# Patient Record
Sex: Female | Born: 1965 | Race: White | Hispanic: No | Marital: Married | Smoking: Never smoker
Health system: Southern US, Community
[De-identification: ages and names within clinical notes are randomized; demographics above are authoritative.]

## PROBLEM LIST (undated history)

## (undated) DIAGNOSIS — D509 Iron deficiency anemia, unspecified: Secondary | ICD-10-CM

## (undated) DIAGNOSIS — K635 Polyp of colon: Secondary | ICD-10-CM

## (undated) DIAGNOSIS — J309 Allergic rhinitis, unspecified: Secondary | ICD-10-CM

## (undated) DIAGNOSIS — K219 Gastro-esophageal reflux disease without esophagitis: Secondary | ICD-10-CM

## (undated) DIAGNOSIS — E669 Obesity, unspecified: Secondary | ICD-10-CM

## (undated) DIAGNOSIS — K649 Unspecified hemorrhoids: Secondary | ICD-10-CM

## (undated) DIAGNOSIS — E538 Deficiency of other specified B group vitamins: Secondary | ICD-10-CM

## (undated) DIAGNOSIS — E039 Hypothyroidism, unspecified: Secondary | ICD-10-CM

## (undated) DIAGNOSIS — E079 Disorder of thyroid, unspecified: Secondary | ICD-10-CM

## (undated) DIAGNOSIS — T7840XA Allergy, unspecified, initial encounter: Secondary | ICD-10-CM

## (undated) DIAGNOSIS — Z8632 Personal history of gestational diabetes: Secondary | ICD-10-CM

## (undated) DIAGNOSIS — K449 Diaphragmatic hernia without obstruction or gangrene: Secondary | ICD-10-CM

## (undated) HISTORY — DX: Gastro-esophageal reflux disease without esophagitis: K21.9

## (undated) HISTORY — DX: Unspecified hemorrhoids: K64.9

## (undated) HISTORY — DX: Deficiency of other specified B group vitamins: E53.8

## (undated) HISTORY — DX: Allergy, unspecified, initial encounter: T78.40XA

## (undated) HISTORY — DX: Polyp of colon: K63.5

## (undated) HISTORY — DX: Personal history of gestational diabetes: Z86.32

## (undated) HISTORY — DX: Hypothyroidism, unspecified: E03.9

## (undated) HISTORY — DX: Diaphragmatic hernia without obstruction or gangrene: K44.9

## (undated) HISTORY — DX: Iron deficiency anemia, unspecified: D50.9

## (undated) HISTORY — DX: Disorder of thyroid, unspecified: E07.9

## (undated) HISTORY — DX: Obesity, unspecified: E66.9

## (undated) HISTORY — DX: Allergic rhinitis, unspecified: J30.9

---

## 1998-02-23 ENCOUNTER — Emergency Department (HOSPITAL_COMMUNITY): Admission: EM | Admit: 1998-02-23 | Discharge: 1998-02-23 | Payer: Self-pay | Admitting: Emergency Medicine

## 1999-05-27 ENCOUNTER — Other Ambulatory Visit: Admission: RE | Admit: 1999-05-27 | Discharge: 1999-05-27 | Payer: Self-pay | Admitting: Obstetrics and Gynecology

## 1999-06-05 ENCOUNTER — Encounter: Admission: RE | Admit: 1999-06-05 | Discharge: 1999-09-03 | Payer: Self-pay | Admitting: Gynecology

## 1999-09-04 ENCOUNTER — Ambulatory Visit (HOSPITAL_COMMUNITY): Admission: RE | Admit: 1999-09-04 | Discharge: 1999-09-04 | Payer: Self-pay | Admitting: Obstetrics and Gynecology

## 1999-09-04 ENCOUNTER — Encounter: Payer: Self-pay | Admitting: Obstetrics and Gynecology

## 1999-10-10 ENCOUNTER — Ambulatory Visit (HOSPITAL_COMMUNITY): Admission: RE | Admit: 1999-10-10 | Discharge: 1999-10-10 | Payer: Self-pay | Admitting: Obstetrics and Gynecology

## 1999-10-10 ENCOUNTER — Encounter: Payer: Self-pay | Admitting: Obstetrics and Gynecology

## 1999-10-11 ENCOUNTER — Encounter: Admission: RE | Admit: 1999-10-11 | Discharge: 2000-01-09 | Payer: Self-pay | Admitting: Obstetrics and Gynecology

## 1999-11-07 ENCOUNTER — Ambulatory Visit (HOSPITAL_COMMUNITY): Admission: RE | Admit: 1999-11-07 | Discharge: 1999-11-07 | Payer: Self-pay | Admitting: Gynecology

## 1999-11-07 ENCOUNTER — Encounter: Payer: Self-pay | Admitting: Gynecology

## 1999-11-11 ENCOUNTER — Encounter (HOSPITAL_COMMUNITY): Admission: RE | Admit: 1999-11-11 | Discharge: 2000-01-03 | Payer: Self-pay | Admitting: Internal Medicine

## 1999-11-20 ENCOUNTER — Inpatient Hospital Stay (HOSPITAL_COMMUNITY): Admission: AD | Admit: 1999-11-20 | Discharge: 1999-11-20 | Payer: Self-pay | Admitting: Gynecology

## 1999-12-09 ENCOUNTER — Encounter: Payer: Self-pay | Admitting: Gynecology

## 2000-01-01 ENCOUNTER — Observation Stay (HOSPITAL_COMMUNITY): Admission: AD | Admit: 2000-01-01 | Discharge: 2000-01-02 | Payer: Self-pay | Admitting: Gynecology

## 2000-01-03 ENCOUNTER — Encounter (INDEPENDENT_AMBULATORY_CARE_PROVIDER_SITE_OTHER): Payer: Self-pay

## 2000-01-03 ENCOUNTER — Inpatient Hospital Stay (HOSPITAL_COMMUNITY): Admission: AD | Admit: 2000-01-03 | Discharge: 2000-01-07 | Payer: Self-pay | Admitting: Gynecology

## 2000-01-08 ENCOUNTER — Encounter: Admission: RE | Admit: 2000-01-08 | Discharge: 2000-02-11 | Payer: Self-pay | Admitting: Gynecology

## 2000-01-26 ENCOUNTER — Inpatient Hospital Stay (HOSPITAL_COMMUNITY): Admission: AD | Admit: 2000-01-26 | Discharge: 2000-01-27 | Payer: Self-pay | Admitting: Gynecology

## 2000-02-24 ENCOUNTER — Other Ambulatory Visit: Admission: RE | Admit: 2000-02-24 | Discharge: 2000-02-24 | Payer: Self-pay | Admitting: Gynecology

## 2002-07-27 ENCOUNTER — Encounter: Admission: RE | Admit: 2002-07-27 | Discharge: 2002-10-25 | Payer: Self-pay | Admitting: Internal Medicine

## 2002-11-09 ENCOUNTER — Encounter: Admission: RE | Admit: 2002-11-09 | Discharge: 2002-11-09 | Payer: Self-pay | Admitting: Internal Medicine

## 2002-11-09 ENCOUNTER — Encounter: Payer: Self-pay | Admitting: Internal Medicine

## 2002-11-14 ENCOUNTER — Encounter: Admission: RE | Admit: 2002-11-14 | Discharge: 2003-02-12 | Payer: Self-pay | Admitting: Internal Medicine

## 2003-02-15 ENCOUNTER — Encounter: Admission: RE | Admit: 2003-02-15 | Discharge: 2003-05-16 | Payer: Self-pay | Admitting: Internal Medicine

## 2003-04-14 ENCOUNTER — Other Ambulatory Visit: Admission: RE | Admit: 2003-04-14 | Discharge: 2003-04-14 | Payer: Self-pay | Admitting: Gynecology

## 2003-05-17 ENCOUNTER — Encounter: Admission: RE | Admit: 2003-05-17 | Discharge: 2003-08-15 | Payer: Self-pay | Admitting: Internal Medicine

## 2003-08-29 ENCOUNTER — Encounter: Admission: RE | Admit: 2003-08-29 | Discharge: 2003-11-27 | Payer: Self-pay | Admitting: Internal Medicine

## 2004-03-05 ENCOUNTER — Encounter: Admission: RE | Admit: 2004-03-05 | Discharge: 2004-03-05 | Payer: Self-pay | Admitting: Internal Medicine

## 2004-03-08 ENCOUNTER — Ambulatory Visit (HOSPITAL_COMMUNITY): Admission: RE | Admit: 2004-03-08 | Discharge: 2004-03-08 | Payer: Self-pay | Admitting: Internal Medicine

## 2004-09-09 ENCOUNTER — Other Ambulatory Visit: Admission: RE | Admit: 2004-09-09 | Discharge: 2004-09-09 | Payer: Self-pay | Admitting: Gynecology

## 2005-09-17 ENCOUNTER — Other Ambulatory Visit: Admission: RE | Admit: 2005-09-17 | Discharge: 2005-09-17 | Payer: Self-pay | Admitting: Gynecology

## 2005-10-06 HISTORY — PX: GASTRIC BYPASS: SHX52

## 2006-09-21 ENCOUNTER — Other Ambulatory Visit: Admission: RE | Admit: 2006-09-21 | Discharge: 2006-09-21 | Payer: Self-pay | Admitting: Gynecology

## 2007-06-29 ENCOUNTER — Encounter: Admission: RE | Admit: 2007-06-29 | Discharge: 2007-06-29 | Payer: Self-pay | Admitting: Internal Medicine

## 2007-10-13 ENCOUNTER — Other Ambulatory Visit: Admission: RE | Admit: 2007-10-13 | Discharge: 2007-10-13 | Payer: Self-pay | Admitting: Gynecology

## 2008-06-29 DIAGNOSIS — K449 Diaphragmatic hernia without obstruction or gangrene: Secondary | ICD-10-CM | POA: Insufficient documentation

## 2008-06-29 DIAGNOSIS — K224 Dyskinesia of esophagus: Secondary | ICD-10-CM

## 2008-07-05 ENCOUNTER — Ambulatory Visit: Payer: Self-pay | Admitting: Internal Medicine

## 2008-07-05 ENCOUNTER — Encounter: Payer: Self-pay | Admitting: Gastroenterology

## 2008-07-05 DIAGNOSIS — K219 Gastro-esophageal reflux disease without esophagitis: Secondary | ICD-10-CM | POA: Insufficient documentation

## 2008-07-05 DIAGNOSIS — E669 Obesity, unspecified: Secondary | ICD-10-CM

## 2008-07-05 DIAGNOSIS — K625 Hemorrhage of anus and rectum: Secondary | ICD-10-CM

## 2008-07-05 DIAGNOSIS — R159 Full incontinence of feces: Secondary | ICD-10-CM | POA: Insufficient documentation

## 2008-07-21 ENCOUNTER — Ambulatory Visit (HOSPITAL_COMMUNITY): Admission: RE | Admit: 2008-07-21 | Discharge: 2008-07-21 | Payer: Self-pay | Admitting: Gastroenterology

## 2008-07-21 ENCOUNTER — Ambulatory Visit: Payer: Self-pay | Admitting: Gastroenterology

## 2009-01-24 ENCOUNTER — Other Ambulatory Visit: Admission: RE | Admit: 2009-01-24 | Discharge: 2009-01-24 | Payer: Self-pay | Admitting: Gynecology

## 2009-01-24 ENCOUNTER — Encounter: Payer: Self-pay | Admitting: Gynecology

## 2009-01-24 ENCOUNTER — Ambulatory Visit: Payer: Self-pay | Admitting: Gynecology

## 2010-03-29 ENCOUNTER — Ambulatory Visit: Payer: Self-pay | Admitting: Gynecology

## 2010-04-09 ENCOUNTER — Other Ambulatory Visit: Admission: RE | Admit: 2010-04-09 | Discharge: 2010-04-09 | Payer: Self-pay | Admitting: Gynecology

## 2010-04-09 ENCOUNTER — Ambulatory Visit: Payer: Self-pay | Admitting: Gynecology

## 2010-04-22 ENCOUNTER — Ambulatory Visit: Payer: Self-pay | Admitting: Gynecology

## 2010-04-24 ENCOUNTER — Ambulatory Visit: Payer: Self-pay | Admitting: Gynecology

## 2010-07-04 ENCOUNTER — Ambulatory Visit: Payer: Self-pay | Admitting: Gynecology

## 2010-07-10 ENCOUNTER — Ambulatory Visit: Payer: Self-pay | Admitting: Gynecology

## 2010-08-06 HISTORY — PX: HYSTEROSCOPY: SHX211

## 2010-08-20 ENCOUNTER — Ambulatory Visit (HOSPITAL_COMMUNITY): Admission: RE | Admit: 2010-08-20 | Discharge: 2010-08-20 | Payer: Self-pay | Admitting: Gynecology

## 2010-08-20 ENCOUNTER — Ambulatory Visit: Payer: Self-pay | Admitting: Gynecology

## 2010-12-18 LAB — CBC
MCH: 30.9 pg (ref 26.0–34.0)
Platelets: 206 10*3/uL (ref 150–400)
RBC: 4.14 MIL/uL (ref 3.87–5.11)

## 2010-12-18 LAB — HCG, SERUM, QUALITATIVE: Preg, Serum: NEGATIVE

## 2011-02-21 NOTE — Op Note (Signed)
[facility]  Patient:    Crystal Wall, Crystal Wall                    MRN: [id number] Proc. Date: 01/25/00 Adm. Date:  [id number] Attending:  Merrily Pew                           Operative Report  PREOPERATIVE DIAGNOSIS:       Wound seroma, rule out abscess and dehiscence.  POSTOPERATIVE DIAGNOSIS:      Wound abscess.  PROCEDURE:                    Exploration and drainage, wound abscess.  SURGEON:                      Timothy P. Fontaine, M.D.  ANESTHESIA:                   IV sedation with local 1% lidocaine skin infiltration.  ESTIMATED BLOOD LOSS:         Minimal.  COMPLICATIONS:                None.  SPECIMENS:                    Aerobic and anaerobic culture.  FINDINGS:                     Deep superficial seroma with underlying abscess; fascial layer intact.  Incision packed with two six-inch Kerlix gauze.  DESCRIPTION OF PROCEDURE:     Patient was taken to the operating room and received IV sedation, received an abdominal preparation with Betadine solution and was draped in the usual fashion.  The skin surrounding the mid-incisional opening was infiltrated using 1% lidocaine and subsequently, the skin incision was separated. A large amount of serous fluid was found and subsequent probing of the subcutaneous tissues revealed frank pus and loculation consistent with abscess formation. Aerobic and anaerobic cultures were taken and subsequently, the subcutaneous tissues were probed to the extent of their dissection, with breaking up of all loculations.  The incision was copiously irrigated and rubbed with a sponge to remove all adherent material to the level of fresh subcutaneous tissue. Subsequently, a small bleeder was addressed with a cautery and the incision was  packed open using two six-inch Kerlix gauzes and a subsequent dressing applied.  The patient was taken to the recovery room in good condition, having  tolerated he procedure well.DD:  01/25/00 TD:  01/27/00 Job: [id number]

## 2011-02-21 NOTE — Discharge Summary (Signed)
[facility] of [facility]  Patient:    Crystal Wall                        MRN: [id number] Adm. Date:  01/03/00 Disc. Date: 01/07/00 Attending:  Marcial Pacas P. Fontaine, M.D. Dictator:   Antony Contras, [facility]                           Discharge Summary  DISCHARGE DIAGNOSES:          1. Intrauterine pregnancy at term.                               2. History of insulin-dependent gestational                                  diabetes.                               3. Morbid obesity.                               4. Hypothyroidism.                               5. Failed induction of labor.  PROCEDURES:                   Low cervical transverse cesarean section with                               delivery of viable infant.  HISTORY OF PRESENT ILLNESS:   The patient is a 45 year old primigravida with LMP of March 29, 1999, [facility] January 02, 2000.  Prenatal risk factors include insulin-dependent gestational diabetes, morbid obesity, hypothyroidism and the patient also has a negative Rubella titer.  PRENATAL LABS:                Blood type O positive. Antibody screen negative. RPR, HBSAG, HIV nonreactive.  GBS negative.  HOSPITAL COURSE/TREATMENT:    The patient was admitted on January 03, 2000, for serial induction.  Cervix was long, closed, high and unengaged.  She did understand that it was likely that the induction would probably not be successful due to her unfavorable cervix.  Due to the failure of this induction, a cesarean section was performed by Timothy P. Fontaine, M.D.  She was delivered of an Apgar 8 and 84 female infant weighing 6 pounds 12 ounces.  POSTPARTUM COURSE:            She remained afebrile.  She had no difficulty voiding.  She did have a JP drain for the incision and was placed on prophylactic antibiotics.  Her FBS ranged from 74 to 120.  She was discharged on January 07, 2000, in satisfactory condition.  She did receive rubella vaccine prior to  discharge.  The JP drain was out.  DISPOSITION:                  Continue prenatal vitamins and iron. Continue Motrin and Tylox for pain.  Follow-up in the office on Friday, January 10, 2000, and  Monday, January 13, 2000, to evaluate the status of the incision. DD:  07/06/00 TD:  07/06/00 Job: [id number]

## 2011-02-21 NOTE — Op Note (Signed)
[facility]  Patient:    Crystal Wall, Crystal Wall                         MRN: [id number] Proc. Date: 01/03/00 Adm. Date:  [id number] Disc. Date: [id number] Attending:  Douglass Rivers                           Operative Report  PREOPERATIVE DIAGNOSES:       1. Gestational diabetes, insulin-dependent.                               2. Term pregnancy.                               3. Failed induction.                               4. Morbidly obesity.                               5. Hypothyroidism.  POSTOPERATIVE DIAGNOSES:      1. Gestational diabetes, insulin-dependent.                               2. Term pregnancy.                               3. Failed induction.                               4. Morbid obesity.                               5. Hypothyroidism.  PROCEDURE:                    Primary classical cesarean section.  SURGEON:                      Timothy P. Fontaine, M.D.  ASSISTANT:                    Douglass Rivers, M.D.  ANESTHESIA:                   Epidural.  COMPLICATIONS:                None.  ESTIMATED BLOOD LOSS:         500 cc.  SPECIMEN:                     Samples of cord blood and placenta.  FINDINGS:                     At 10:28 normal female infant.  Apgars 8 and 9. Weighed 6 pounds 12 ounces.  Pelvic anatomy noted to be normal to limited inspection.  PROCEDURE:                    Patient underwent epidural anesthesia.  Was placed in the  supine position and had her panniculus taped to expose the lower abdomen. he abdomen was then prepared with Betadine scrub and Betadine solution.  A Foley catheter was placed in sterile technique and the patient was draped in the usual fashion.  The abdomen was then sharply entered at the level of the panniculus attempting to place the incision line out of the groin fold.  Adequate hemostasis was achieved along the way through electrocautery and the abdomen was sharply entered.  The  uterus was then exposed and due to the lack of development of a lower uterine segment, initially it was attempted to initiate a low midline incision ut it was found that an extension to the upper uterine segment was necessary and this was consistent with a classical cesarean section.  The membranes were identified, ruptured, and subsequently the infant was converted to a footling breech and underwent a breech extraction without difficulty.  The nares and mouth were suctioned, the cord doubly clamped and cut.  The infant was handed to pediatrics in attendance.  Samples of cord blood were obtained.  The placenta was then spontaneously extruded, noted to be intact.  The endometrial cavity explored with a sponge to remove all placental and membrane fragments.  The uterine incision was then closed in multiple layers initiating with a running interlocking intramuscular 0 Vicryl stitch followed by a running 0 Vicryl superficial muscular stitch. Several 2-0 Vicryl interrupted sutures were placed for hemostasis at the incision line and subsequently adequate hemostasis was achieved.  The uterus was returned to the abdomen which was copiously irrigated and subsequently the fascia was reapproximated using 0 PDS starting at the angle meeting in the middle.  Due to the large volume of subcutaneous tissue, it was felt most prudent to attempt to reapproximation loosely and interrupted 3-0 plain subcutaneous stitches were placed with two intervening Jackson-Pratt drains, one along the fascial layer and one n a more superficial cutaneous layer.  The skin was reapproximated with staples. The two Jackson-Pratt drains were secured to the skin using 3-0 silk suture. Sterile dressing applied.  The patient was taken to the recovery room in good condition  having tolerated procedure well. DD:  01/03/00 TD:  01/03/00 Job: [id number]

## 2011-02-21 NOTE — H&P (Signed)
[facility]  Patient:    MAJEL, GIEL                         MRN: [id number] Adm. Date:  [id number] Disc. Date: [id number] Attending:  Douglass Rivers                         History and Physical  CHIEF COMPLAINT:              Incisional complication.  HISTORY OF PRESENT ILLNESS:   A 45 year old female status post cesarean section  three weeks ago.  Notes over the last several days increasing temperature to 102 with mid incisional drainage.  Patient was evaluated in the office and started n oral Augmentin 875 b.i.d. yesterday and this evening noticed drainage of copious amounts of fluid from her incision.  Was instructed to report to the emergency room.  Evaluation in the emergency room revealed copious amounts of serous fluid from a mid incisional opening.  She is admitted at this time for exploration of her incision in the operating room.  PAST MEDICAL HISTORY:         Hypothyroidism.  PAST SURGICAL HISTORY:        Cesarean section and abdominal cyst excision.  ALLERGIES:                    None.  MEDICATIONS:                  Motrin, Augmentin, Synthroid 0.1 mg p.o. q.d.  REVIEW OF SYSTEMS:            Noncontributory.  FAMILY HISTORY:               Noncontributory.  SOCIAL HISTORY:               Noncontributory.  PHYSICAL EXAMINATION:  VITAL SIGNS:                  Temperature 100.8.  Vital signs are stable.  HEENT:                        Normal.  LUNGS:                        Clear.  CARDIAC:                      Regular rate.  No rubs, murmurs, or gallops.  ABDOMEN:                      Incision well healed except for small mid incisional area.  This area was probed initially and then subsequently injected with 1% lidocaine and opened approximately 1-2 cm allowing digital exploration and a copious amount of serous fluid extruded with palpation of a large cavity subincisional.  ASSESSMENT:                   A 45 year old  status post cesarean section x 3 weeks with large incisional seroma, possible abscess for surgical exploration.  I discussed with the patient and her husband what is involved and that my preference would be to accomplish this in the operating room and they agree to the plan with all their questions answered. DD:  01/24/00 TD:  01/25/00 Job: [id number]

## 2011-02-21 NOTE — Discharge Summary (Signed)
[facility]  Patient:    Crystal Wall, Crystal Wall                    MRN: [id number] Adm. Date:  [id number] Disc. Date: [id number] Attending:  Merrily Pew Dictator:   Antony Contras, RNC, [facility]                           Discharge Summary  DISCHARGE DIAGNOSES:          1. Postoperative cesarean section x 3 weeks.                               2. Wound abscess.  PROCEDURE:                    Exploration and drainage of the wound abscess.  HISTORY OF PRESENT ILLNESS:   The patient is a 45 year old female post cesarean  section x 3 weeks who noted over the last several days increasing temperature to 102 with some midincisional drainage.  She was initially evaluated in the office and started on Augmentin, but continued to notice copious amount of drainage from the incision.  She is admitted for exploration of the incision in the operating  room.  HOSPITAL COURSE:              The patient was admitted for operative exploration and drainage of the wound abscess which was performed by Timothy P. Fontaine, M.D. under IV sedation.  The findings showed a superficial seroma with underlying abscess.  The patient remained in the hospital for two days and the incision was packed with gauze, irrigated postoperatively.  She was continued on IV antibiotics. She responded well to this therapy and was able to be discharged on her second postoperative day to follow up in the office in a week and continue on her Tylox for pain. DD:  02/14/00 TD:  02/14/00 Job: [id number]

## 2011-04-15 ENCOUNTER — Other Ambulatory Visit (HOSPITAL_COMMUNITY)
Admission: RE | Admit: 2011-04-15 | Discharge: 2011-04-15 | Disposition: A | Discharge status: Home / Self Care | Payer: 59 | Source: Ambulatory Visit | Attending: Gynecology | Admitting: Gynecology

## 2011-04-15 ENCOUNTER — Encounter (INDEPENDENT_AMBULATORY_CARE_PROVIDER_SITE_OTHER): Payer: 59 | Admitting: Gynecology

## 2011-04-15 ENCOUNTER — Other Ambulatory Visit: Payer: Self-pay | Admitting: Gynecology

## 2011-04-15 DIAGNOSIS — R82998 Other abnormal findings in urine: Secondary | ICD-10-CM

## 2011-04-15 DIAGNOSIS — Z01419 Encounter for gynecological examination (general) (routine) without abnormal findings: Secondary | ICD-10-CM

## 2011-04-15 DIAGNOSIS — Z124 Encounter for screening for malignant neoplasm of cervix: Secondary | ICD-10-CM | POA: Insufficient documentation

## 2011-05-26 ENCOUNTER — Encounter: Payer: Self-pay | Admitting: Gynecology

## 2011-09-05 ENCOUNTER — Telehealth: Payer: Self-pay | Admitting: *Deleted

## 2011-09-05 NOTE — Telephone Encounter (Signed)
Pt called and left message c/o yeast infection and would like rx called in. Pt last office visit was in July, Lm on pt vm she would need to make OV.

## 2011-09-25 ENCOUNTER — Ambulatory Visit (INDEPENDENT_AMBULATORY_CARE_PROVIDER_SITE_OTHER): Payer: 59

## 2011-09-25 DIAGNOSIS — Z23 Encounter for immunization: Secondary | ICD-10-CM

## 2011-11-02 ENCOUNTER — Emergency Department (INDEPENDENT_AMBULATORY_CARE_PROVIDER_SITE_OTHER): Payer: No Typology Code available for payment source

## 2011-11-02 ENCOUNTER — Encounter (HOSPITAL_BASED_OUTPATIENT_CLINIC_OR_DEPARTMENT_OTHER): Payer: Self-pay | Admitting: *Deleted

## 2011-11-02 ENCOUNTER — Emergency Department (HOSPITAL_BASED_OUTPATIENT_CLINIC_OR_DEPARTMENT_OTHER)
Admission: EM | Admit: 2011-11-02 | Discharge: 2011-11-02 | Disposition: A | Discharge status: Home / Self Care | Payer: No Typology Code available for payment source | Attending: Emergency Medicine | Admitting: Emergency Medicine

## 2011-11-02 DIAGNOSIS — S8263XA Displaced fracture of lateral malleolus of unspecified fibula, initial encounter for closed fracture: Secondary | ICD-10-CM

## 2011-11-02 DIAGNOSIS — M7730 Calcaneal spur, unspecified foot: Secondary | ICD-10-CM

## 2011-11-02 DIAGNOSIS — Y92410 Unspecified street and highway as the place of occurrence of the external cause: Secondary | ICD-10-CM | POA: Insufficient documentation

## 2011-11-02 DIAGNOSIS — S93409A Sprain of unspecified ligament of unspecified ankle, initial encounter: Secondary | ICD-10-CM | POA: Insufficient documentation

## 2011-11-02 DIAGNOSIS — M7989 Other specified soft tissue disorders: Secondary | ICD-10-CM

## 2011-11-02 DIAGNOSIS — E039 Hypothyroidism, unspecified: Secondary | ICD-10-CM | POA: Insufficient documentation

## 2011-11-02 DIAGNOSIS — E669 Obesity, unspecified: Secondary | ICD-10-CM | POA: Insufficient documentation

## 2011-11-02 MED ORDER — IBUPROFEN 800 MG PO TABS
800.0000 mg | ORAL_TABLET | Freq: Once | ORAL | Status: AC
  Administered 2011-11-02: 800 mg via ORAL

## 2011-11-02 MED ORDER — IBUPROFEN 800 MG PO TABS: 800.0000 mg | ORAL_TABLET | Freq: Three times a day (TID) | ORAL | Status: AC

## 2011-11-02 MED ORDER — IBUPROFEN 800 MG PO TABS
ORAL_TABLET | ORAL | Status: AC
  Filled 2011-11-02: qty 1

## 2011-11-02 MED ORDER — IBUPROFEN 800 MG PO TABS: 800.0000 mg | ORAL_TABLET | Freq: Three times a day (TID) | ORAL | Status: DC

## 2011-11-02 NOTE — ED Notes (Signed)
Patient was not given crutches.

## 2011-11-02 NOTE — ED Notes (Signed)
Per EMT Lauren and PA Clydie Braun, the crutches we stock are not adequate for patient's weight bearing (bariatric) PA to order a walker for patient.
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
## 2011-11-02 NOTE — ED Provider Notes (Signed)
Medical screening examination/treatment/procedure(s) were performed by non-physician practitioner and as supervising physician I was immediately available for consultation/collaboration.   Forbes Cellar, MD 11/02/11 2333
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
## 2011-11-02 NOTE — ED Notes (Signed)
Patient fitted with large cam walker. Patient verbalizes comfort from fitting, and has been given instructions on care, etc. Onalee Hua, EMT in room for assistance in fitting.

## 2012-04-16 ENCOUNTER — Ambulatory Visit (INDEPENDENT_AMBULATORY_CARE_PROVIDER_SITE_OTHER): Payer: 59 | Admitting: Gynecology

## 2012-04-16 ENCOUNTER — Encounter: Payer: Self-pay | Admitting: Gynecology

## 2012-04-16 VITALS — BP 120/74 | Ht 62.0 in | Wt 367.0 lb

## 2012-04-16 DIAGNOSIS — Z01419 Encounter for gynecological examination (general) (routine) without abnormal findings: Secondary | ICD-10-CM

## 2012-04-16 DIAGNOSIS — N920 Excessive and frequent menstruation with regular cycle: Secondary | ICD-10-CM

## 2012-04-16 NOTE — Patient Instructions (Signed)
Follow up in one year for her annual gynecologic exam. 

## 2012-04-16 NOTE — Progress Notes (Signed)
Crystal Wall Feb 12, 1966 [id number]        46 y.o.  G1P1 for annual exam.  Several issues noted below.  Past medical history,surgical history, medications, allergies, family history and social history were all reviewed and documented in the EPIC chart. ROS:  Was performed and pertinent positives and negatives are included in the history.  Exam: [name] assistant Filed Vitals:   04/16/12 1616  BP: 120/74  Height: 5\' 2"  (1.575 m)  Weight: 367 lb (166.47 kg)   General appearance  Normal Skin grossly normal Head/Neck normal with no cervical or supraclavicular adenopathy thyroid normal Lungs  clear Cardiac RR, without RMG Abdominal  soft, nontender, without masses, organomegaly or hernia Breasts  examined lying and sitting without masses, retractions, discharge or axillary adenopathy. Pelvic  Ext/BUS/vagina  normal   Cervix  normal   Uterus difficult to palpate but grossly normal bimanual without masses or tenderness   Adnexa  Without masses or tenderness    Anus and perineum  normal   Rectovaginal  normal sphincter tone without palpated masses or tenderness.    Assessment/Plan:  46 y.o. G1P1 female for annual exam, vasectomy birth control.   1. Menorrhagia. Patient has a history of heavier menses. She is status post hysteroscopy D&C for benign endometrial polyp 08/2010. Menses are regular. Discussed options again with her to include hormonal ablation, Mirena IUD, endometrial ablation, hysterectomy. Patient does not want to do anything at this time. She'll follow up if she wants to pursue treatment. 2. Pap smear. Last Pap smear 2012. No Pap smear done today. Patient has no history of abnormal Pap smears with numerous normal reports in her chart. Discussed current screening guidelines we'll plan less frequent screening every 3-5 years. 3. Mammography. Patients do for her mammogram August 2013 in this to schedule this. SBE monthly reviewed. 4. Health maintenance. No blood work was done  today this is all done through her primary physician's office. Assuming she continues well she will see me in a year, sooner as needed.    [name], 4:49 PM 04/16/2012

## 2012-04-16 NOTE — Addendum Note (Signed)
Addended by: Dara Lords on: 04/16/2012 05:00 PM   Modules accepted: Orders

## 2012-05-31 ENCOUNTER — Encounter: Payer: Self-pay | Admitting: Gynecology

## 2013-06-07 ENCOUNTER — Ambulatory Visit (INDEPENDENT_AMBULATORY_CARE_PROVIDER_SITE_OTHER): Payer: 59 | Admitting: Gynecology

## 2013-06-07 ENCOUNTER — Telehealth: Payer: Self-pay | Admitting: Gastroenterology

## 2013-06-07 ENCOUNTER — Encounter: Payer: Self-pay | Admitting: Gynecology

## 2013-06-07 VITALS — BP 126/80 | Ht 62.0 in | Wt 367.0 lb

## 2013-06-07 DIAGNOSIS — Z01419 Encounter for gynecological examination (general) (routine) without abnormal findings: Secondary | ICD-10-CM

## 2013-06-07 NOTE — Progress Notes (Signed)
Crystal Wall [birth year] [id number]        47 y.o.  G1P1 for annual exam.  Doing well without complaints.  Past medical history,surgical history, medications, allergies, family history and social history were all reviewed and documented in the EPIC chart.  ROS:  Performed and pertinent positives and negatives are included in the history, assessment and plan .  Exam: Kim assistant Filed Vitals:   06/07/13 1359  BP: 126/80  Height: 5\' 2"  (1.575 m)  Weight: 367 lb (166.47 kg)   General appearance  Normal Skin grossly normal Head/Neck normal with no cervical or supraclavicular adenopathy thyroid normal Lungs  clear Cardiac RR, without RMG Abdominal  morbidly obese, soft, nontender, without gross masses, organomegaly or hernia Breasts  examined lying and sitting without masses, retractions, discharge or axillary adenopathy. Pelvic  Ext/BUS/vagina  normal  Cervix  normal  Uterus  difficult to palpate but grossly normal size, midline and mobile nontender   Adnexa  Without masses or tenderness    Anus and perineum  normal   Rectovaginal  normal sphincter tone without palpated masses or tenderness.    Assessment/Plan:  47 y.o. G1P1 female for annual exam, regular menses, vasectomy birth control.   1. Heavy menses x1 day. Regular monthly without intermenstrual bleeding. Have discussed the options with her and she is contemplating Mirena IUD. Literature was given and she'll followup if she chooses. 2. Mammography 05/2013. Continue with annual mammography. SBE monthly reviewed. 3. Pap smear 2012. No Pap smear done today. No history of abnormal Pap smears previously. Plan repeat Pap smear next year 3 year interval. 4. Health maintenance. Patient reports routine blood work done at other physician's offices. Followup in one year, sooner if she decides to pursue IUD.  Note: This document was prepared with digital dictation and possible smart phrase technology. Any transcriptional errors that  result from this process are unintentional.   [name], 2:28 PM 06/07/2013

## 2013-06-07 NOTE — Telephone Encounter (Signed)
Pt had colon in October of 2009, report in epic. Called office back but they are closed for lunch. Will try again after 2pm.

## 2013-06-07 NOTE — Telephone Encounter (Signed)
Pt last colon in October of 2009. Pts sister just recently passed away from colon cancer. PCP wants to know if it is ok for pt to wait until 2019 to repeat colon or should it be done sooner now with family history. Please advise.

## 2013-06-07 NOTE — Telephone Encounter (Signed)
She should have a colonoscopy this year

## 2013-06-07 NOTE — Patient Instructions (Signed)
Followup in one year for annual exam. Sooner if you decides to pursue Mirena IUD.

## 2013-06-08 ENCOUNTER — Encounter: Payer: Self-pay | Admitting: Gynecology

## 2013-06-09 NOTE — Telephone Encounter (Signed)
Spoke with [place] and she is aware and will let pt know to schedule colon some time this year.

## 2013-06-09 NOTE — Telephone Encounter (Signed)
Left message for [facility] to call back.

## 2013-07-12 ENCOUNTER — Encounter: Payer: Self-pay | Admitting: Gastroenterology

## 2013-07-14 ENCOUNTER — Encounter: Payer: 59 | Admitting: Gynecology

## 2013-08-11 ENCOUNTER — Other Ambulatory Visit: Payer: Self-pay

## 2013-08-16 ENCOUNTER — Telehealth: Payer: Self-pay | Admitting: Gastroenterology

## 2013-08-16 NOTE — Telephone Encounter (Signed)
Spoke with pt and discussed that if BMI is greater than or equal to 50 procedure must be done at the hospital. Pt states that nothing has changed in 5 years so pt should be done at [facility]. Pt requests an appt on a Wed if possible. If cannot be done on a Wed. Pt stated it would need to be done after the 1st of the year.

## 2013-08-16 NOTE — Telephone Encounter (Signed)
Left message for pt to call back  °

## 2013-08-26 ENCOUNTER — Other Ambulatory Visit: Payer: Self-pay

## 2013-08-26 ENCOUNTER — Telehealth: Payer: Self-pay | Admitting: Gastroenterology

## 2013-08-26 DIAGNOSIS — Z1211 Encounter for screening for malignant neoplasm of colon: Secondary | ICD-10-CM

## 2013-08-26 NOTE — Telephone Encounter (Signed)
Discussed with pt that Dr. Arlyce Dice does not have a spot on a Wednesday before the end of the year. Pt is fine waiting to be scheduled in Jan.

## 2013-08-26 NOTE — Telephone Encounter (Signed)
Pt scheduled for Colon at [facility] 10/24/12@12 :30pm. Pt scheduled for previsit 10/11/13@8am . Pt aware of appt dates and times.

## 2013-08-26 NOTE — Telephone Encounter (Signed)
Pts colon needs to be done at [facility] in Jan. Cannot do 1/15 or 1/16. Will call pt back when scheduled.

## 2013-10-11 ENCOUNTER — Ambulatory Visit (AMBULATORY_SURGERY_CENTER): Payer: Self-pay

## 2013-10-11 VITALS — Ht 62.0 in | Wt 381.8 lb

## 2013-10-11 DIAGNOSIS — Z800 Family history of malignant neoplasm of digestive organs: Secondary | ICD-10-CM

## 2013-10-11 MED ORDER — SUPREP BOWEL PREP KIT 17.5-3.13-1.6 GM/177ML PO SOLN: 1.0000 | Freq: Once | ORAL | Status: DC

## 2013-10-12 ENCOUNTER — Encounter: Payer: Self-pay | Admitting: Gastroenterology

## 2013-10-17 ENCOUNTER — Other Ambulatory Visit: Payer: Self-pay | Admitting: *Deleted

## 2013-10-17 DIAGNOSIS — Z800 Family history of malignant neoplasm of digestive organs: Secondary | ICD-10-CM

## 2013-10-24 ENCOUNTER — Encounter (HOSPITAL_COMMUNITY)
Admission: RE | Disposition: A | Discharge status: Home / Self Care | Payer: Self-pay | Source: Ambulatory Visit | Attending: Gastroenterology

## 2013-10-24 ENCOUNTER — Ambulatory Visit (HOSPITAL_COMMUNITY)
Admission: RE | Admit: 2013-10-24 | Discharge: 2013-10-24 | Disposition: A | Discharge status: Home / Self Care | Payer: 59 | Source: Ambulatory Visit | Attending: Gastroenterology | Admitting: Gastroenterology

## 2013-10-24 ENCOUNTER — Encounter (HOSPITAL_COMMUNITY): Payer: Self-pay

## 2013-10-24 DIAGNOSIS — D126 Benign neoplasm of colon, unspecified: Secondary | ICD-10-CM

## 2013-10-24 DIAGNOSIS — Z9884 Bariatric surgery status: Secondary | ICD-10-CM | POA: Insufficient documentation

## 2013-10-24 DIAGNOSIS — E039 Hypothyroidism, unspecified: Secondary | ICD-10-CM | POA: Insufficient documentation

## 2013-10-24 DIAGNOSIS — Z1211 Encounter for screening for malignant neoplasm of colon: Secondary | ICD-10-CM | POA: Insufficient documentation

## 2013-10-24 DIAGNOSIS — E669 Obesity, unspecified: Secondary | ICD-10-CM | POA: Insufficient documentation

## 2013-10-24 DIAGNOSIS — Z800 Family history of malignant neoplasm of digestive organs: Secondary | ICD-10-CM

## 2013-10-24 HISTORY — PX: COLONOSCOPY: SHX5424

## 2013-10-24 SURGERY — COLONOSCOPY
Anesthesia: Moderate Sedation

## 2013-10-24 MED ORDER — FENTANYL CITRATE 0.05 MG/ML IJ SOLN
INTRAMUSCULAR | Status: DC | PRN
  Administered 2013-10-24 (×4): 25 ug via INTRAVENOUS

## 2013-10-24 MED ORDER — SODIUM CHLORIDE 0.9 % IV SOLN
INTRAVENOUS | Status: DC
  Administered 2013-10-24: 500 mL via INTRAVENOUS

## 2013-10-24 MED ORDER — MIDAZOLAM HCL 5 MG/5ML IJ SOLN
INTRAMUSCULAR | Status: DC | PRN
  Administered 2013-10-24: 1 mg via INTRAVENOUS
  Administered 2013-10-24: 2 mg via INTRAVENOUS
  Administered 2013-10-24: 1 mg via INTRAVENOUS
  Administered 2013-10-24: 2 mg via INTRAVENOUS
  Administered 2013-10-24: 1 mg via INTRAVENOUS

## 2013-10-24 MED ORDER — MIDAZOLAM HCL 10 MG/2ML IJ SOLN
INTRAMUSCULAR | Status: AC
  Filled 2013-10-24: qty 2

## 2013-10-24 MED ORDER — FENTANYL CITRATE 0.05 MG/ML IJ SOLN
INTRAMUSCULAR | Status: AC
  Filled 2013-10-24: qty 4

## 2013-10-24 NOTE — Op Note (Signed)
[facility] [place], [address]   COLONOSCOPY PROCEDURE REPORT  PATIENT: Crystal Wall, Crystal Wall  MR#: [id number] BIRTHDATE: [birth year] , 40  yrs. old GENDER: Female ENDOSCOPIST: Inda Castle, MD REFERRED Mariel Craft, M.D. PROCEDURE DATE:  10/24/2013 PROCEDURE:   Colonoscopy with snare polypectomy First Screening Colonoscopy - Avg.  risk and is 50 yrs.  old or older - No.  Prior Negative Screening - Now for repeat screening. Above average risk  History of Adenoma - Now for follow-up colonoscopy & has been > or = to 3 yrs.  N/A  Polyps Removed Today? Yes. ASA CLASS:   Class III INDICATIONS:Patient's immediate family history of colon cancer. MEDICATIONS: These medications were titrated to patient response per physician's verbal order, Versed 7 mg IV, and Fentanyl 100 mcg IV  DESCRIPTION OF PROCEDURE:   After the risks benefits and alternatives of the procedure were thoroughly explained, informed consent was obtained.  A digital rectal exam revealed no abnormalities of the rectum.   The Pentax Adult Colonscope [id number] endoscope was introduced through the anus and advanced to the cecum, which was identified by both the appendix and ileocecal valve. No adverse events experienced.   The quality of the prep was excellent using Suprep  The instrument was then slowly withdrawn as the colon was fully examined.      COLON FINDINGS: A sessile polyp measuring 4 mm in size was found in the descending colon.  A polypectomy was performed with a cold snare.  The resection was complete and the polyp tissue was completely retrieved.   The colon was otherwise normal.  There was no diverticulosis, inflammation, polyps or cancers unless previously stated.  Retroflexed views revealed no abnormalities. The time to cecum=  .  Withdrawal time=11 minutes 0 seconds.  The scope was withdrawn and the procedure completed. COMPLICATIONS: There were no  complications.  ENDOSCOPIC IMPRESSION: 1.   Sessile polyp measuring 4 mm in size was found in the descending colon; polypectomy was performed with a cold snare 2.   The colon was otherwise normal  RECOMMENDATIONS: Given your significant family history of colon cancer, you should have a repeat colonoscopy in 5 years   eSigned:  Inda Castle, MD 10/24/2013 1:09 PM   cc:   PATIENT NAME:  Crystal Wall, Crystal Wall MR#: [id number]

## 2013-10-24 NOTE — H&P (Signed)
                _                                                                                                                History of Present Illness:  48 year old white female here for colonoscopy.  Family history is pertinent for sister who developed colon cancer in her 1s.  Patient has no GI complaints.  Last colonoscopy in 2009 demonstrated internal hemorrhoids.    Past Medical History  Diagnosis Date  . Obesity   . Thyroid disease     hypothyroid  . Hx gestational diabetes    Past Surgical History  Procedure Laterality Date  . Cesarean section  2001  . Gastric bypass  2007  . Hysteroscopy  08/2010   family history includes Cancer in her father and sister; Colon cancer in her sister; Congestive Heart Failure in her father; Diabetes in her maternal grandmother and sister; Heart disease in her father; Hypertension in her father, mother, and sister. Current Facility-Administered Medications  Medication Dose Route Frequency Provider Last Rate Last Dose  . 0.9 %  sodium chloride infusion   Intravenous Continuous Inda Castle, MD 20 mL/hr at 10/24/13 1142 500 mL at 10/24/13 1142   Allergies as of 08/26/2013  . (No Known Allergies)    reports that she has never smoked. She has never used smokeless tobacco. She reports that she drinks alcohol. She reports that she does not use illicit drugs.     Review of Systems: Pertinent positive and negative review of systems were noted in the above HPI section. All other review of systems were otherwise negative.  Vital signs were reviewed in today's medical record Physical Exam: General: Well developed , well nourished, no acute distress Skin: anicteric Head: Normocephalic and atraumatic Eyes:  sclerae anicteric, EOMI Ears: Normal auditory acuity Mouth: No deformity or lesions Neck: Supple, no masses or thyromegaly Lungs: Clear throughout to auscultation Heart: Regular rate and rhythm; no murmurs, rubs or bruits Abdomen:  Soft, non tender and non distended. No masses, hepatosplenomegaly or hernias noted. Normal Bowel sounds Rectal:deferred Musculoskeletal: Symmetrical with no gross deformities  Skin: No lesions on visible extremities Pulses:  Normal pulses noted Extremities: No clubbing, cyanosis, edema or deformities noted Neurological: Alert oriented x 4, grossly nonfocal Cervical Nodes:  No significant cervical adenopathy Inguinal Nodes: No significant inguinal adenopathy Psychological:  Alert and cooperative. Normal mood and affect  Impression-family history of colon cancer  Plan colonoscopy

## 2013-10-24 NOTE — Discharge Instructions (Signed)
Colonoscopy, Care After °Refer to this sheet in the next few weeks. These instructions provide you with information on caring for yourself after your procedure. Your health care provider may also give you more specific instructions. Your treatment has been planned according to current medical practices, but problems sometimes occur. Call your health care provider if you have any problems or questions after your procedure. °WHAT TO EXPECT AFTER THE PROCEDURE  °After your procedure, it is typical to have the following: °· A small amount of blood in your stool. °· Moderate amounts of gas and mild abdominal cramping or bloating. °HOME CARE INSTRUCTIONS °· Do not drive, operate machinery, or sign important documents for 24 hours. °· You may shower and resume your regular physical activities, but move at a slower pace for the first 24 hours. °· Take frequent rest periods for the first 24 hours. °· Walk around or put a warm pack on your abdomen to help reduce abdominal cramping and bloating. °· Drink enough fluids to keep your urine clear or pale yellow. °· You may resume your normal diet as instructed by your health care provider. Avoid heavy or fried foods that are hard to digest. °· Avoid drinking alcohol for 24 hours or as instructed by your health care provider. °· Only take over-the-counter or prescription medicines as directed by your health care provider. °· If a tissue sample (biopsy) was taken during your procedure: °· Do not take aspirin or blood thinners for 7 days, or as instructed by your health care provider. °· Do not drink alcohol for 7 days, or as instructed by your health care provider. °· Eat soft foods for the first 24 hours. °SEEK MEDICAL CARE IF: °You have persistent spotting of blood in your stool 2 3 days after the procedure. °SEEK IMMEDIATE MEDICAL CARE IF: °· You have more than a small spotting of blood in your stool. °· You pass large blood clots in your stool. °· Your abdomen is swollen  (distended). °· You have nausea or vomiting. °· You have a fever. °· You have increasing abdominal pain that is not relieved with medicine. °Document Released: 05/06/2004 Document Revised: 07/13/2013 Document Reviewed: 05/30/2013 °ExitCare® Patient Information ©2014 [employer]. ° °

## 2013-10-25 ENCOUNTER — Encounter (HOSPITAL_COMMUNITY): Payer: Self-pay | Admitting: Gastroenterology

## 2013-10-28 ENCOUNTER — Encounter: Payer: Self-pay | Admitting: Gastroenterology

## 2013-11-03 ENCOUNTER — Telehealth: Payer: Self-pay | Admitting: *Deleted

## 2013-11-03 NOTE — Telephone Encounter (Signed)
Pt called c/o no cycle since Nov 2014, ? If in menopause. I left message on pt voicemail to make OV with TF.

## 2014-01-03 ENCOUNTER — Ambulatory Visit (INDEPENDENT_AMBULATORY_CARE_PROVIDER_SITE_OTHER): Payer: 59 | Admitting: Gynecology

## 2014-01-03 ENCOUNTER — Encounter: Payer: Self-pay | Admitting: Gynecology

## 2014-01-03 DIAGNOSIS — N926 Irregular menstruation, unspecified: Secondary | ICD-10-CM

## 2014-01-03 DIAGNOSIS — D649 Anemia, unspecified: Secondary | ICD-10-CM

## 2014-01-03 DIAGNOSIS — N920 Excessive and frequent menstruation with regular cycle: Secondary | ICD-10-CM

## 2014-01-03 LAB — CBC WITH DIFFERENTIAL/PLATELET
BASOS ABS: 0.1 10*3/uL (ref 0.0–0.1)
BASOS PCT: 1 % (ref 0–1)
EOS PCT: 3 % (ref 0–5)
Eosinophils Absolute: 0.2 10*3/uL (ref 0.0–0.7)
HEMATOCRIT: 31.7 % — AB (ref 36.0–46.0)
HEMOGLOBIN: 9.7 g/dL — AB (ref 12.0–15.0)
Lymphocytes Relative: 23 % (ref 12–46)
Lymphs Abs: 1.4 10*3/uL (ref 0.7–4.0)
MCH: 22.7 pg — ABNORMAL LOW (ref 26.0–34.0)
MCHC: 30.6 g/dL (ref 30.0–36.0)
MCV: 74.2 fL — AB (ref 78.0–100.0)
MONOS PCT: 5 % (ref 3–12)
Monocytes Absolute: 0.3 10*3/uL (ref 0.1–1.0)
Neutro Abs: 4.3 10*3/uL (ref 1.7–7.7)
Neutrophils Relative %: 68 % (ref 43–77)
Platelets: 290 10*3/uL (ref 150–400)
RBC: 4.27 MIL/uL (ref 3.87–5.11)
RDW: 17.3 % — AB (ref 11.5–15.5)
WBC: 6.3 10*3/uL (ref 4.0–10.5)

## 2014-01-03 LAB — FOLLICLE STIMULATING HORMONE: FSH: 5.9 m[IU]/mL

## 2014-01-03 LAB — PROLACTIN: Prolactin: 9.9 ng/mL

## 2014-01-03 NOTE — Progress Notes (Signed)
Crystal Wall [birth year] [id number]        48 y.o.  G1P1 presents to discuss options as far as heavier periods.  Patient is having menses approximately every 6 weeks heavy for one day. No prolonged or intermenstrual bleeding. Was found to be anemic by her primary and was told that they thought was due to her periods and to talk to me about possible endometrial ablation. Had normal exam 06/2013. Declines exam today he just wants to talk.  Past medical history,surgical history, problem list, medications, allergies, family history and social history were all reviewed and documented in the EPIC chart.   Assessment/Plan:  48 y.o. G1P1 with one to 2 days of heavy menses every 6 weeks. I reviewed options for menstrual suppression to include hormonal/IUD/endometrial ablation. Using vasectomy for contraception. The pros/cons, risks/benefits of each choice reviewed. Do not feel estrogen containing products-wise given the risks of thrombosis. Progesterone only with the side effect profile possibilities. I think her best choice would be to consider Mirena IUD initially with followup possible endometrial ablation if fails the IUD. The progesterone effect of the IUD will also protect the endometrium given her less frequent menses. Does have her thyroid checked every 6 months and reports being euthyroid. Will check FSH prolactin and baseline CBC. Do not feel sonohysterogram necessary at this point but would if she wants to proceed with ablation as a preoperative evaluation. Patient is leaning towards the IUD and will schedule. She'll followup if she wants to rediscuss options.   Note: This document was prepared with digital dictation and possible smart phrase technology. Any transcriptional errors that result from this process are unintentional.   [name] MD, 9:44 AM 01/03/2014

## 2014-01-03 NOTE — Patient Instructions (Signed)
Followup with your decision as far as IUD.  Intrauterine Device Insertion Most often, an intrauterine device (IUD) is inserted into the uterus to prevent pregnancy. There are 2 types of IUDs available:  Copper IUD This type of IUD creates an environment that is not favorable to sperm survival. The mechanism of action of the copper IUD is not known for certain. It can stay in place for 10 years.  Hormone IUD This type of IUD contains the hormone progestin (synthetic progesterone). The progestin thickens the cervical mucus and prevents sperm from entering the uterus, and it also thins the uterine lining. There is no evidence that the hormone IUD prevents implantation. One hormone IUD can stay in place for up to 5 years, and a different hormone IUD can stay in place for up to 3 years. An IUD is the most cost-effective birth control if left in place for the full duration. It may be removed at any time. LET [facility] CARE PROVIDER KNOW ABOUT:  Any allergies you have.  All medicines you are taking, including vitamins, herbs, eye drops, creams, and over-the-counter medicines.  Previous problems you or members of your family have had with the use of anesthetics.  Any blood disorders you have.  Previous surgeries you have had.  Possibility of pregnancy.  Medical conditions you have. RISKS AND COMPLICATIONS  Generally, intrauterine device insertion is a safe procedure. However, as with any procedure, complications can occur. Possible complications include:  Accidental puncture (perforation) of the uterus.  Accidental placement of the IUD either in the muscle layer of the uterus (myometrium) or outside the uterus. If this happens, the IUD can be found essentially floating around the bowels and must be taken out surgically.  The IUD may fall out of the uterus (expulsion). This is more common in women who have recently had a child.   Pregnancy in the fallopian tube (ectopic).  Pelvic  inflammatory disease (PID), which is infection of the uterus and fallopian tubes. The risk of PID is slightly increased in the first 20 days after the IUD is placed, but the overall risk is still very low. BEFORE THE PROCEDURE  Schedule the IUD insertion for when you will have your menstrual period or right after, to make sure you are not pregnant. Placement of the IUD is better tolerated shortly after a menstrual cycle.  You may need to take tests or be examined to make sure you are not pregnant.  You may be required to take a pregnancy test.  You may be required to get checked for sexually transmitted infections (STIs) prior to placement. Placing an IUD in someone who has an infection can make the infection worse.  You may be given a pain reliever to take 1 or 2 hours before the procedure.  An exam will be performed to determine the size and position of your uterus.  Ask your health care provider about changing or stopping your regular medicines. PROCEDURE   A tool (speculum) is placed in the vagina. This allows your health care provider to see the lower part of the uterus (cervix).  The cervix is prepped with a medicine that lowers the risk of infection.  You may be given a medicine to numb each side of the cervix (intracervical or paracervical block). This is used to block and control any discomfort with insertion.  A tool (uterine sound) is inserted into the uterus to determine the length of the uterine cavity and the direction the uterus may be tilted.  A slim instrument (IUD inserter) is inserted through the cervical canal and into your uterus.  The IUD is placed in the uterine cavity and the insertion device is removed.  The nylon string that is attached to the IUD and used for eventual IUD removal is trimmed. It is trimmed so that it lays high in the vagina, just outside the cervix. AFTER THE PROCEDURE  You may have bleeding after the procedure. This is normal. It varies  from light spotting for a few days to menstrual-like bleeding.  You may have mild cramping. Document Released: 05/21/2011 Document Revised: 07/13/2013 Document Reviewed: 03/13/2013 [facility] Patient Information 2014 [address].

## 2014-01-04 ENCOUNTER — Encounter: Payer: Self-pay | Admitting: Gynecology

## 2014-01-04 ENCOUNTER — Telehealth: Payer: Self-pay | Admitting: Gynecology

## 2014-01-04 ENCOUNTER — Other Ambulatory Visit: Payer: Self-pay | Admitting: Gynecology

## 2014-01-04 DIAGNOSIS — Z3049 Encounter for surveillance of other contraceptives: Secondary | ICD-10-CM

## 2014-01-04 MED ORDER — LEVONORGESTREL 20 MCG/24HR IU IUD: INTRAUTERINE_SYSTEM | Freq: Once | INTRAUTERINE | Status: DC

## 2014-01-04 NOTE — Telephone Encounter (Signed)
01/04/14-Spoke with pt today to let her know her insurance will cover the Mirena and insertion with a $35.00 copay. She will call to schedule this in a couple of months.WL

## 2014-02-16 ENCOUNTER — Telehealth: Payer: Self-pay | Admitting: *Deleted

## 2014-02-16 NOTE — Telephone Encounter (Signed)
Pt informed with recent [place] level result.

## 2014-06-09 ENCOUNTER — Encounter: Payer: Self-pay | Admitting: Gynecology

## 2014-06-29 ENCOUNTER — Encounter: Payer: Self-pay | Admitting: Gynecology

## 2014-06-29 ENCOUNTER — Ambulatory Visit (INDEPENDENT_AMBULATORY_CARE_PROVIDER_SITE_OTHER): Payer: 59 | Admitting: Gynecology

## 2014-06-29 ENCOUNTER — Other Ambulatory Visit (HOSPITAL_COMMUNITY): Admission: RE | Admit: 2014-06-29 | Payer: 59 | Source: Ambulatory Visit | Admitting: Gynecology

## 2014-06-29 ENCOUNTER — Other Ambulatory Visit (HOSPITAL_COMMUNITY)
Admission: RE | Admit: 2014-06-29 | Discharge: 2014-06-29 | Disposition: A | Discharge status: Home / Self Care | Payer: 59 | Source: Ambulatory Visit | Attending: Gynecology | Admitting: Gynecology

## 2014-06-29 VITALS — BP 130/82 | Ht 61.5 in | Wt 374.0 lb

## 2014-06-29 DIAGNOSIS — Z01419 Encounter for gynecological examination (general) (routine) without abnormal findings: Secondary | ICD-10-CM | POA: Diagnosis present

## 2014-06-29 NOTE — Patient Instructions (Signed)
Follow up for the ultrasound as scheduled. Start on a daily iron supplement.

## 2014-06-29 NOTE — Progress Notes (Signed)
Crystal Wall 1966/09/19 [id number]        48 y.o.  G1P1 for annual exam.  Several issues noted below.  Past medical history,surgical history, problem list, medications, allergies, family history and social history were all reviewed and documented as reviewed in the EPIC chart.  ROS:  12 system ROS performed with pertinent positives and negatives included in the history, assessment and plan.   Additional significant findings :  none   Exam: Kim Counsellor Vitals:   06/29/14 1430  BP: 130/82  Height: 5' 1.5" (1.562 m)  Weight: 374 lb (169.645 kg)   General appearance:  Normal affect, orientation and appearance. Skin: Grossly normal HEENT: Without gross lesions.  No cervical or supraclavicular adenopathy. Thyroid normal.  Lungs:  Clear without wheezing, rales or rhonchi Cardiac: RR, without RMG Abdominal:  Soft, nontender, without masses, guarding, rebound, organomegaly or hernia Breasts:  Examined lying and sitting without masses, retractions, discharge or axillary adenopathy. Pelvic:  Ext/BUS/vagina normal  Cervix normal. Pap done  Uterus grossly normal, nontender. Difficult to palpate.  Adnexa  Without gross masses or tenderness    Anus and perineum  Normal   Rectovaginal  Normal sphincter tone without palpated masses or tenderness.    Assessment/Plan:  47 y.o. G1P1 female for annual exam menses every other month, vasectomy birth control.   1. Was evaluated earlier this year for heavy menses. Hemoglobin was 9. Was to do the Mirena IUD but never followed up for this.  Menses continue heavy about every 6-8 weeks. No intermenstrual bleeding. History of prior endometrial polyp status post resection 2011. Patient is interested in pursuing the Mirena IUD now and we'll make arrangements to do so. Did recommend ultrasound baseline given the limits of the pelvic exam due to her BMI of 69. We'll set her up for a sonohysterogram in the event that an endometrial defect is noted.  I  again stressed the need to start on iron and she agrees to do so. Recheck hemoglobin today. 2. Pap smear 2012. Pap smear/HPV done today. No history of abnormal Pap smears previously. 3. Mammography 06/2014. Continue with annual mammography. SBE monthly reviewed. 4. Health maintenance. No routine blood work done as she has this done through her primary physician's office. Follow up for sonohysterogram otherwise annually.     [name] MD, 3:04 PM 06/29/2014

## 2014-06-29 NOTE — Addendum Note (Signed)
Addended by: Nelva Nay on: 06/29/2014 03:14 PM   Modules accepted: Orders

## 2014-06-30 LAB — CBC WITH DIFFERENTIAL/PLATELET
BASOS ABS: 0.1 10*3/uL (ref 0.0–0.1)
Basophils Relative: 1 % (ref 0–1)
Eosinophils Absolute: 0.1 10*3/uL (ref 0.0–0.7)
Eosinophils Relative: 2 % (ref 0–5)
HEMATOCRIT: 29.8 % — AB (ref 36.0–46.0)
Hemoglobin: 9.1 g/dL — ABNORMAL LOW (ref 12.0–15.0)
LYMPHS PCT: 23 % (ref 12–46)
Lymphs Abs: 1.6 10*3/uL (ref 0.7–4.0)
MCH: 22 pg — ABNORMAL LOW (ref 26.0–34.0)
MCHC: 30.5 g/dL (ref 30.0–36.0)
MCV: 72 fL — AB (ref 78.0–100.0)
MONO ABS: 0.4 10*3/uL (ref 0.1–1.0)
Monocytes Relative: 6 % (ref 3–12)
NEUTROS ABS: 4.8 10*3/uL (ref 1.7–7.7)
Neutrophils Relative %: 68 % (ref 43–77)
Platelets: 317 10*3/uL (ref 150–400)
RBC: 4.14 MIL/uL (ref 3.87–5.11)
RDW: 17.7 % — AB (ref 11.5–15.5)
WBC: 7.1 10*3/uL (ref 4.0–10.5)

## 2014-07-03 LAB — CYTOLOGY - PAP

## 2014-07-10 ENCOUNTER — Other Ambulatory Visit: Payer: Self-pay | Admitting: Gynecology

## 2014-07-10 DIAGNOSIS — N921 Excessive and frequent menstruation with irregular cycle: Secondary | ICD-10-CM

## 2014-07-26 ENCOUNTER — Other Ambulatory Visit: Payer: Self-pay | Admitting: Internal Medicine

## 2014-07-26 ENCOUNTER — Ambulatory Visit
Admission: RE | Admit: 2014-07-26 | Discharge: 2014-07-26 | Disposition: A | Discharge status: Home / Self Care | Payer: 59 | Source: Ambulatory Visit | Attending: Internal Medicine | Admitting: Internal Medicine

## 2014-07-26 DIAGNOSIS — R0781 Pleurodynia: Secondary | ICD-10-CM

## 2014-07-26 DIAGNOSIS — R0602 Shortness of breath: Secondary | ICD-10-CM

## 2014-07-26 MED ORDER — IOHEXOL 350 MG/ML SOLN
150.0000 mL | Freq: Once | INTRAVENOUS | Status: AC | PRN
  Administered 2014-07-26: 150 mL via INTRAVENOUS

## 2014-07-31 ENCOUNTER — Other Ambulatory Visit: Payer: 59

## 2014-07-31 ENCOUNTER — Ambulatory Visit: Payer: 59 | Admitting: Gynecology

## 2014-08-07 ENCOUNTER — Encounter: Payer: Self-pay | Admitting: Gynecology

## 2014-08-09 ENCOUNTER — Ambulatory Visit (INDEPENDENT_AMBULATORY_CARE_PROVIDER_SITE_OTHER): Payer: 59

## 2014-08-09 ENCOUNTER — Ambulatory Visit (INDEPENDENT_AMBULATORY_CARE_PROVIDER_SITE_OTHER): Payer: 59 | Admitting: Gynecology

## 2014-08-09 ENCOUNTER — Other Ambulatory Visit: Payer: Self-pay | Admitting: Gynecology

## 2014-08-09 ENCOUNTER — Encounter: Payer: Self-pay | Admitting: Gynecology

## 2014-08-09 DIAGNOSIS — N921 Excessive and frequent menstruation with irregular cycle: Secondary | ICD-10-CM

## 2014-08-09 DIAGNOSIS — N920 Excessive and frequent menstruation with regular cycle: Secondary | ICD-10-CM

## 2014-08-09 DIAGNOSIS — N83202 Unspecified ovarian cyst, left side: Secondary | ICD-10-CM

## 2014-08-09 DIAGNOSIS — N8320 Unspecified ovarian cysts: Secondary | ICD-10-CM

## 2014-08-09 DIAGNOSIS — D251 Intramural leiomyoma of uterus: Secondary | ICD-10-CM

## 2014-08-09 NOTE — Patient Instructions (Signed)
Follow up for Mirena IUD as arranged.  Intrauterine Device Insertion Most often, an intrauterine device (IUD) is inserted into the uterus to prevent pregnancy. There are 2 types of IUDs available:  Copper IUD--This type of IUD creates an environment that is not favorable to sperm survival. The mechanism of action of the copper IUD is not known for certain. It can stay in place for 10 years.  Hormone IUD--This type of IUD contains the hormone progestin (synthetic progesterone). The progestin thickens the cervical mucus and prevents sperm from entering the uterus, and it also thins the uterine lining. There is no evidence that the hormone IUD prevents implantation. One hormone IUD can stay in place for up to 5 years, and a different hormone IUD can stay in place for up to 3 years. An IUD is the most cost-effective birth control if left in place for the full duration. It may be removed at any time. LET [facility] CARE PROVIDER KNOW ABOUT:  Any allergies you have.  All medicines you are taking, including vitamins, herbs, eye drops, creams, and over-the-counter medicines.  Previous problems you or members of your family have had with the use of anesthetics.  Any blood disorders you have.  Previous surgeries you have had.  Possibility of pregnancy.  Medical conditions you have. RISKS AND COMPLICATIONS  Generally, intrauterine device insertion is a safe procedure. However, as with any procedure, complications can occur. Possible complications include:  Accidental puncture (perforation) of the uterus.  Accidental placement of the IUD either in the muscle layer of the uterus (myometrium) or outside the uterus. If this happens, the IUD can be found essentially floating around the bowels and must be taken out surgically.  The IUD may fall out of the uterus (expulsion). This is more common in women who have recently had a child.   Pregnancy in the fallopian tube (ectopic).  Pelvic  inflammatory disease (PID), which is infection of the uterus and fallopian tubes. The risk of PID is slightly increased in the first 20 days after the IUD is placed, but the overall risk is still very low. BEFORE THE PROCEDURE  Schedule the IUD insertion for when you will have your menstrual period or right after, to make sure you are not pregnant. Placement of the IUD is better tolerated shortly after a menstrual cycle.  You may need to take tests or be examined to make sure you are not pregnant.  You may be required to take a pregnancy test.  You may be required to get checked for sexually transmitted infections (STIs) prior to placement. Placing an IUD in someone who has an infection can make the infection worse.  You may be given a pain reliever to take 1 or 2 hours before the procedure.  An exam will be performed to determine the size and position of your uterus.  Ask your health care provider about changing or stopping your regular medicines. PROCEDURE   A tool (speculum) is placed in the vagina. This allows your health care provider to see the lower part of the uterus (cervix).  The cervix is prepped with a medicine that lowers the risk of infection.  You may be given a medicine to numb each side of the cervix (intracervical or paracervical block). This is used to block and control any discomfort with insertion.  A tool (uterine sound) is inserted into the uterus to determine the length of the uterine cavity and the direction the uterus may be tilted.  A slim  instrument (IUD inserter) is inserted through the cervical canal and into your uterus.  The IUD is placed in the uterine cavity and the insertion device is removed.  The nylon string that is attached to the IUD and used for eventual IUD removal is trimmed. It is trimmed so that it lays high in the vagina, just outside the cervix. AFTER THE PROCEDURE  You may have bleeding after the procedure. This is normal. It varies  from light spotting for a few days to menstrual-like bleeding.  You may have mild cramping. Document Released: 05/21/2011 Document Revised: 07/13/2013 Document Reviewed: 03/13/2013 [facility] Patient Information 2015 [place], [place]. This information is not intended to replace advice given to you by your health care provider. Make sure you discuss any questions you have with your health care provider.

## 2014-08-09 NOTE — Progress Notes (Signed)
Crystal Wall April 25, 1966 [id number]        48 y.o.  G1P1 Presents for ultrasound. Patient has history of menses every 6-8 weeks heavy with associated anemia. Is contemplating Mirena IUD and I scheduled an ultrasound given the limits of pelvic exam and her past history of endometrial polyps to make sure there is no gross pathology.  Past medical history,surgical history, problem list, medications, allergies, family history and social history were all reviewed and documented in the EPIC chart.  Directed ROS with pertinent positives and negatives documented in the history of present illness/assessment and plan.  Ultrasound shows uterus overall normal size. 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 13 mm subserosal. Endometrial echo 8.0 mm tri-layered. Right and left ovaries normal with physiologic changes. Cul-de-sac negative.  Assessment/Plan:  48 y.o. G1P1 with ultrasound showing normal endometrial echo and no evidence of polyps or other pathology. She does have several small myomas which on review of her prior ultrasound years ago is stable in size. Patient wants to proceed with the Mirena IUD. I reviewed the insertional process to include the risks of infection, perforation or migration requiring surgery to remove, hormonal absorption side effects and failure with pregnancy. Husband does use vasectomy also. Patient will make appointment to follow up for the IUD placement with her menses.     [name] MD, 4:44 PM 08/09/2014

## 2014-08-11 ENCOUNTER — Telehealth: Payer: Self-pay | Admitting: Gynecology

## 2014-08-11 NOTE — Telephone Encounter (Signed)
08/11/14-I rechecked pt Crystal Wall benefits as she is using it for menorrhagia, not contraception,confirmed with TF, so her ins applying charges to her unmet deductible with pt being responsible for entire $1615.00 cost. She will not proceed at this time/wl

## 2015-03-14 ENCOUNTER — Ambulatory Visit (INDEPENDENT_AMBULATORY_CARE_PROVIDER_SITE_OTHER): Payer: 59 | Admitting: Gynecology

## 2015-03-14 ENCOUNTER — Encounter: Payer: Self-pay | Admitting: Gynecology

## 2015-03-14 VITALS — BP 130/80

## 2015-03-14 DIAGNOSIS — N912 Amenorrhea, unspecified: Secondary | ICD-10-CM | POA: Diagnosis not present

## 2015-03-14 LAB — TSH: TSH: 5.389 u[IU]/mL — ABNORMAL HIGH (ref 0.350–4.500)

## 2015-03-14 NOTE — Progress Notes (Signed)
Crystal Wall [birth year] [id number]        49 y.o.  G1P1 patient presents with over the past year or so menses every 6-8 weeks which were heavy. Evaluation in November 2015 showed ultrasound was normal. Was contemplating Mirena IUD.  Blood work 12/2013 showed [place] and prolactin normal.  Notes her last menstrual period was December 1 and she has done no bleeding since then. No hot flashes night sweats or other symptoms.  Past medical history,surgical history, problem list, medications, allergies, family history and social history were all reviewed and documented in the EPIC chart.  Directed ROS with pertinent positives and negatives documented in the history of present illness/assessment and plan.  Exam: Kim assistant Filed Vitals:   03/14/15 1130  BP: 130/80   General appearance:  Normal Abdomen soft nontender without gross masses Pelvic external BUS vagina normal. Cervix normal. Uterus difficult to palpate but no gross pelvic masses or tenderness  Assessment/Plan:  49 y.o. G1P1 with amenorrhea since December. Vasectomy birth control. No symptoms to suggest menopause. Check baseline labs of TSH FSH prolactin and hCG. Discussed differential to include possible early menopause versus anovulatory amenorrhea. If labs normal discussed progesterone withdrawal, possible repeat ultrasound. If [place] elevated consistent with menopause them will plan expected management and as long as less frequent but regular menses will follow. Will rediscuss after lab results.    [name] MD, 12:05 PM 03/14/2015

## 2015-03-14 NOTE — Patient Instructions (Signed)
Office will call you with lab results 

## 2015-03-15 ENCOUNTER — Other Ambulatory Visit: Payer: Self-pay | Admitting: Gynecology

## 2015-03-15 DIAGNOSIS — R7989 Other specified abnormal findings of blood chemistry: Secondary | ICD-10-CM

## 2015-03-15 LAB — HCG, SERUM, QUALITATIVE: PREG SERUM: NEGATIVE

## 2015-03-15 LAB — PROLACTIN: PROLACTIN: 7.1 ng/mL

## 2015-03-15 LAB — FOLLICLE STIMULATING HORMONE: FSH: 20.2 m[IU]/mL

## 2015-03-15 MED ORDER — MEDROXYPROGESTERONE ACETATE 10 MG PO TABS: 10.0000 mg | ORAL_TABLET | Freq: Every day | ORAL | Status: DC

## 2015-03-21 ENCOUNTER — Other Ambulatory Visit: Payer: Self-pay

## 2015-03-27 ENCOUNTER — Other Ambulatory Visit: Payer: Self-pay | Admitting: Gynecology

## 2015-03-30 IMAGING — CT CT ANGIO CHEST
2 of 6 series · 19 of 36 positions shown · IV contrast ([ID] OMNI 350)
Comparison: None

CLINICAL DATA: Shortness of breath for 1 week with chest pain,
pleura dyspnea, personal history of GERD

EXAM:
CT ANGIOGRAPHY CHEST WITH CONTRAST
TECHNIQUE: Multidetector CT imaging of the chest was performed using the
standard protocol during bolus administration of intravenous
contrast. Multiplanar CT image reconstructions and MIPs were
obtained to evaluate the vascular anatomy.
CONTRAST:  150mL OMNIPAQUE IOHEXOL 350 MG/ML SOLN IV

[Series 5: pe thin 1.25 · axial · 0.70mm/px · z∈[-256,-75]mm · 18 of 203 slices shown]
[im 11/203  lung]
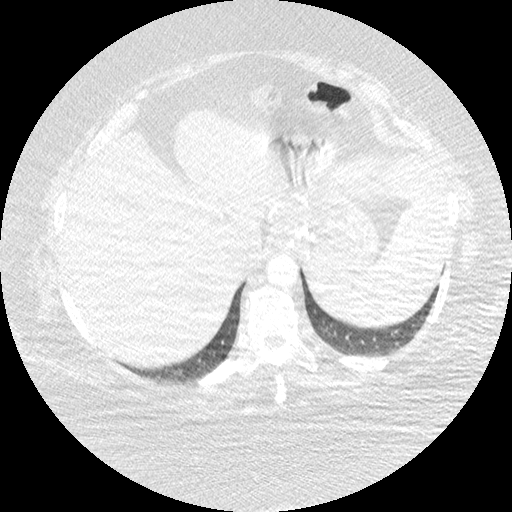
[im 22/203  mediastinal]
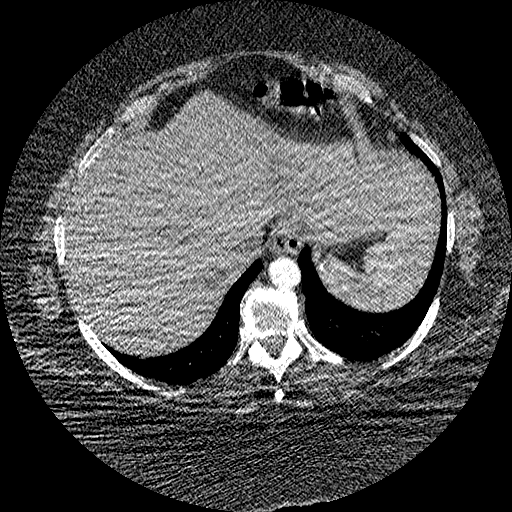
[im 32/203  lung]
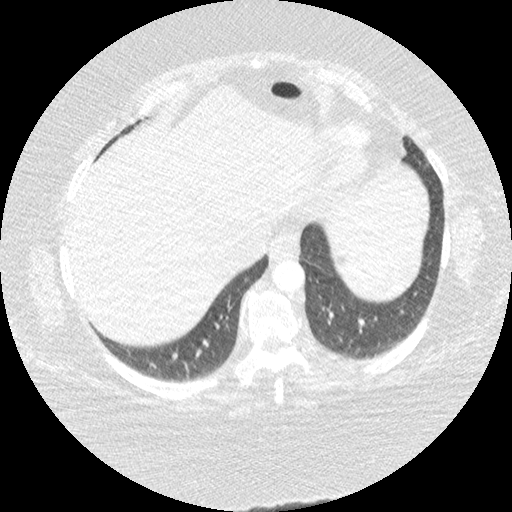
[im 43/203  mediastinal]
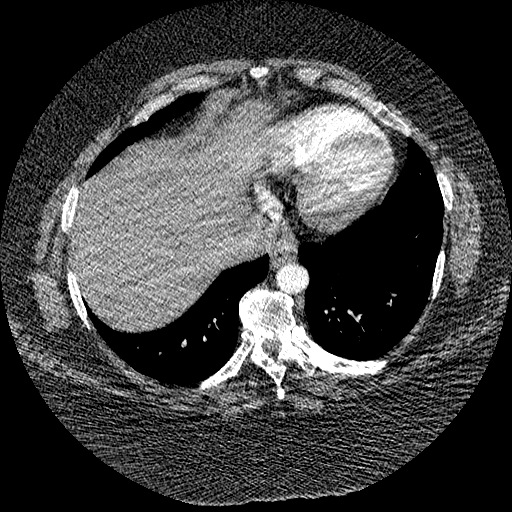
[im 54/203  lung]
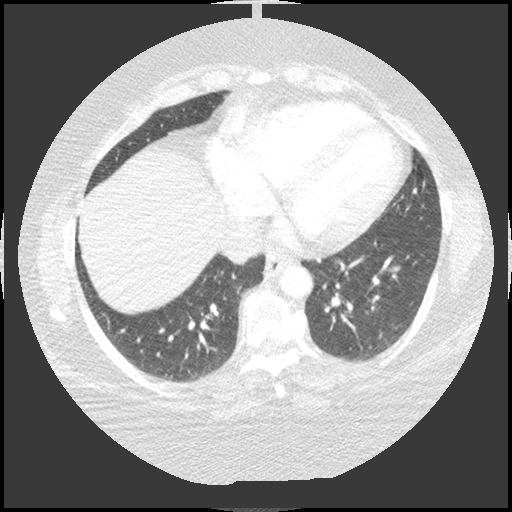
[im 64/203  mediastinal]
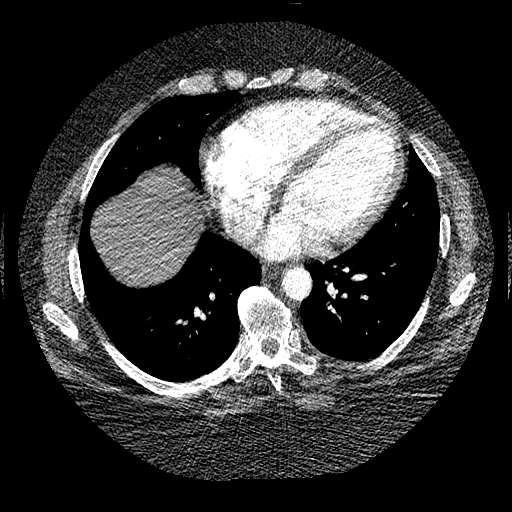
[im 75/203  lung]
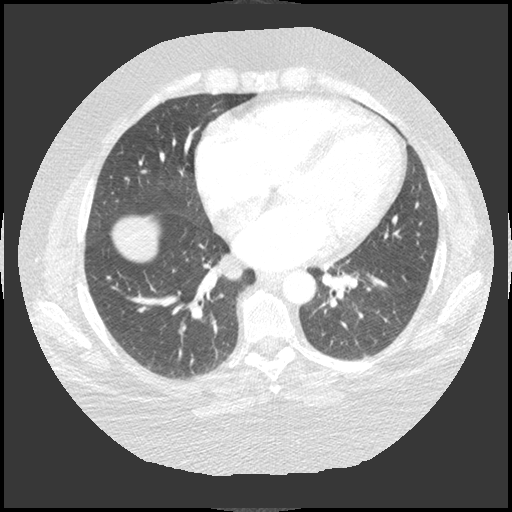
[im 86/203  mediastinal]
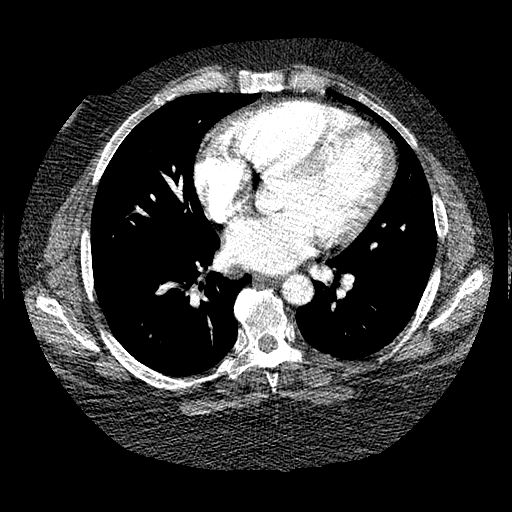
[im 96/203  lung]
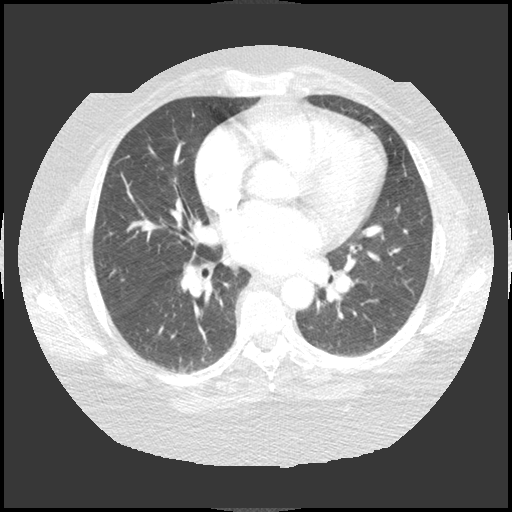
[im 107/203  mediastinal]
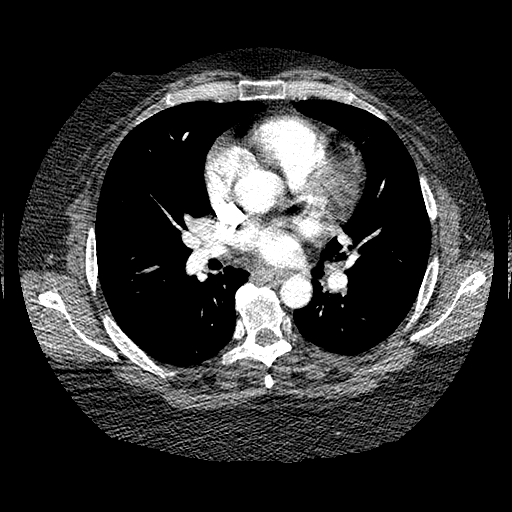
[im 117/203  lung]
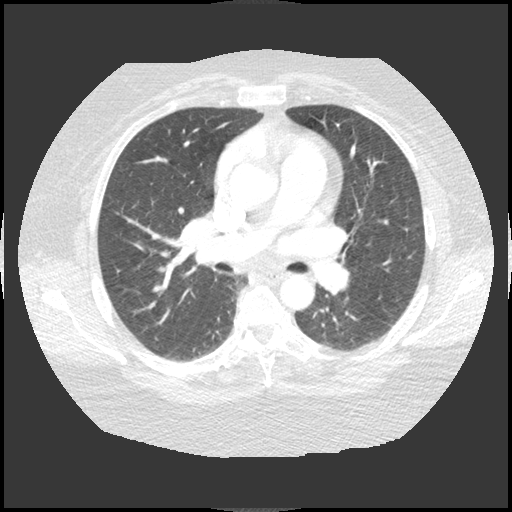
[im 128/203  mediastinal]
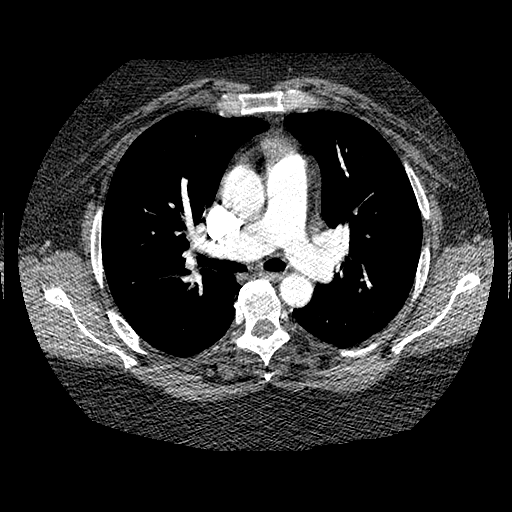
[im 139/203  lung]
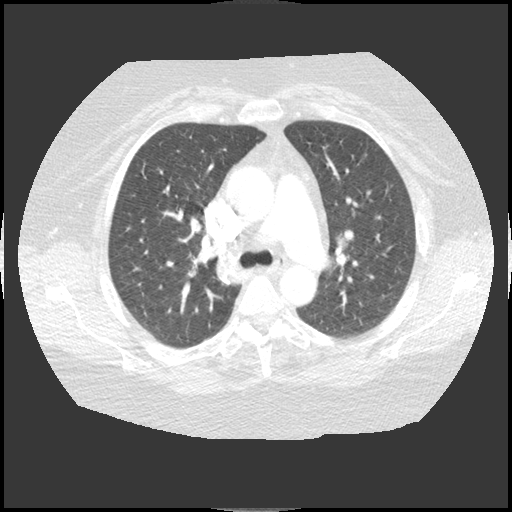
[im 149/203  mediastinal]
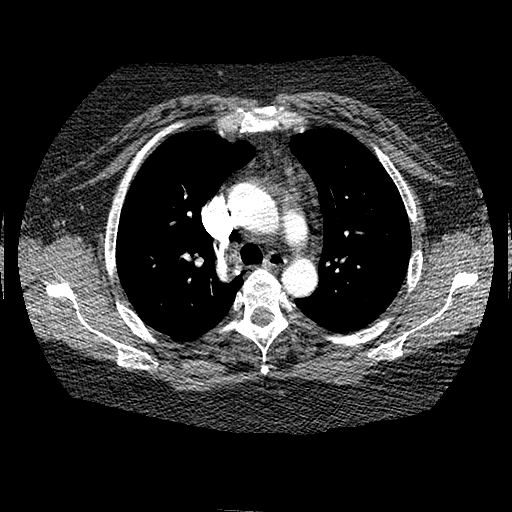
[im 160/203  lung]
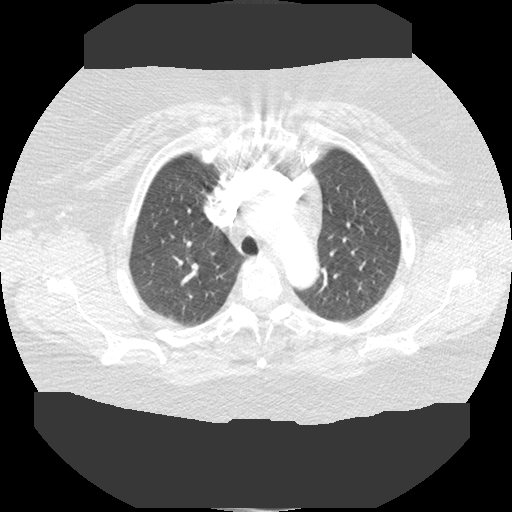
[im 171/203  mediastinal]
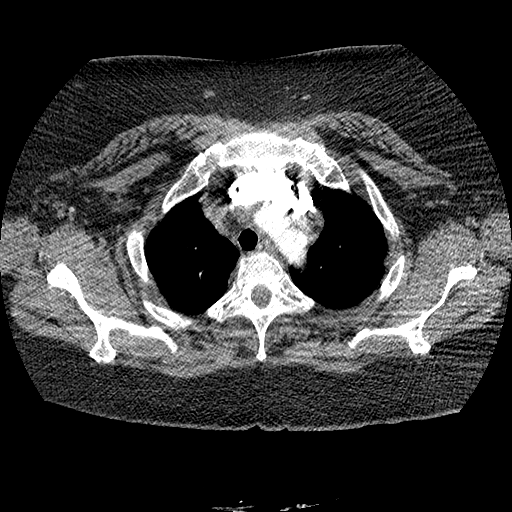
[im 181/203  lung]
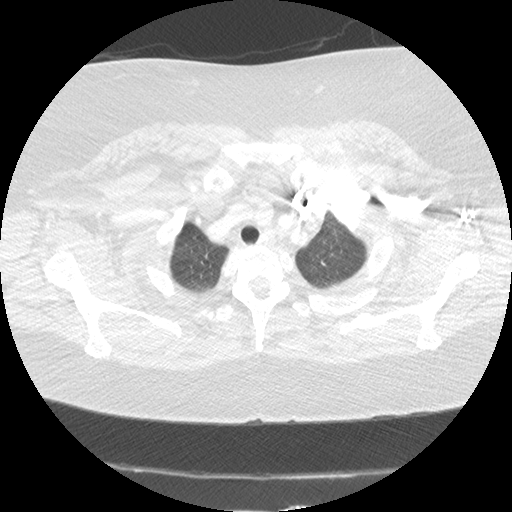
[im 192/203  mediastinal]
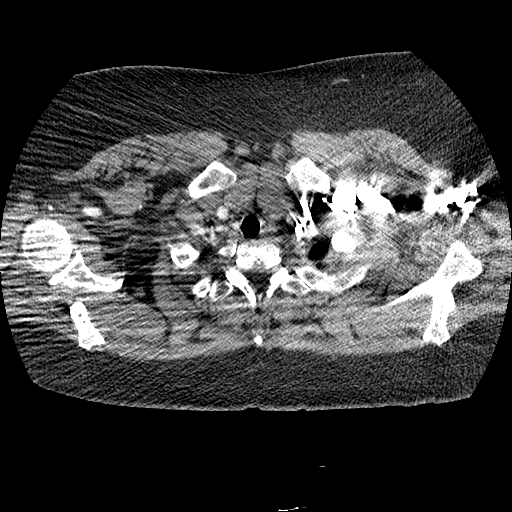

[Series 104: cor · coronal · 0.70mm/px · 1 of 170 slices shown]
[im 85/170  mediastinal]
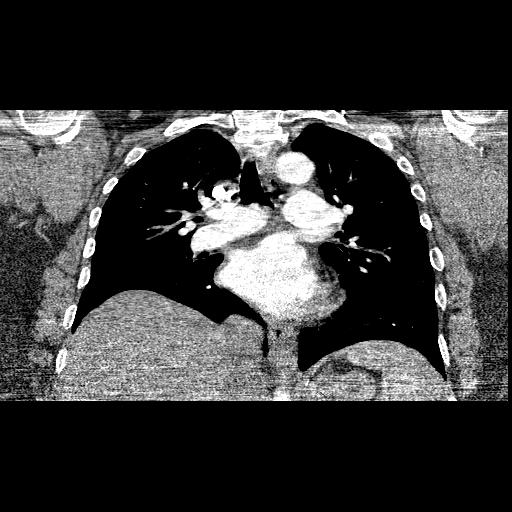

[19 of 36 positions shown; findings below may reference images not displayed]

FINDINGS: Aorta normal caliber without aneurysm or dissection.

Beam hardening artifacts and degradation of image quality secondary
to body habitus.

Within these limitations, pulmonary arteries appear grossly patent
without evidence of pulmonary embolism.

2.3 x 1.3 x 1.5 cm diameter in RIGHT infrahilar nodule adjacent to
RIGHT heart border, intermediate attenuation, question lymph node
versus mildly complicated pericardial cyst.

No additional thoracic adenopathy.

Mild diffuse thyromegaly without focal mass.

Lungs clear.

No infiltrate, pleural effusion, pneumothorax or mass/nodule.

No acute osseous findings.

Review of the MIP images confirms the above findings.
IMPRESSION: No definite evidence of pulmonary embolism as discussed above.

Question enlarged lymph node versus mildly complicated pericardial
cyst RIGHT infrahilar adjacent to RIGHT heart border 2.3 x 1.3 x
cm.

Diffuse thyromegaly without obvious mass.

## 2015-08-13 ENCOUNTER — Encounter: Payer: Self-pay | Admitting: Gynecology

## 2016-03-04 ENCOUNTER — Ambulatory Visit: Payer: Self-pay | Admitting: Gastroenterology

## 2016-08-06 ENCOUNTER — Encounter: Payer: Self-pay | Admitting: Gynecology

## 2016-11-05 ENCOUNTER — Encounter: Payer: Self-pay | Admitting: Gynecology

## 2016-11-05 ENCOUNTER — Ambulatory Visit (INDEPENDENT_AMBULATORY_CARE_PROVIDER_SITE_OTHER): Payer: Commercial Managed Care - HMO | Admitting: Gynecology

## 2016-11-05 VITALS — BP 134/86 | Ht 63.0 in | Wt 381.0 lb

## 2016-11-05 DIAGNOSIS — Z01419 Encounter for gynecological examination (general) (routine) without abnormal findings: Secondary | ICD-10-CM | POA: Diagnosis not present

## 2016-11-05 NOTE — Patient Instructions (Signed)

## 2016-11-05 NOTE — Progress Notes (Signed)
    Crystal Wall [birth year] [id number]        51 y.o.  G1P1 for annual exam.    Past medical history,surgical history, problem list, medications, allergies, family history and social history were all reviewed and documented as reviewed in the EPIC chart.  ROS:  Performed with pertinent positives and negatives included in the history, assessment and plan.   Additional significant findings :  None   Exam: [name] assistant Vitals:   11/05/16 1033  BP: 134/86  Weight: (!) 381 lb (172.8 kg)  Height: 5\' 3"  (1.6 m)   Body mass index is 67.49 kg/m.  General appearance:  Normal affect, orientation and appearance. Skin: Grossly normal HEENT: Without gross lesions.  No cervical or supraclavicular adenopathy. Thyroid normal.  Lungs:  Clear without wheezing, rales or rhonchi Cardiac: RR, without RMG Abdominal:  Soft, nontender, without masses, guarding, rebound, organomegaly or hernia Breasts:  Examined lying and sitting without masses, retractions, discharge or axillary adenopathy. Pelvic:  Ext, BUS, Vagina normal  Cervix normal  Uterus unable to palpate. No gross masses or tenderness   Adnexa without gross masses or tenderness    Anus and perineum normal   Rectovaginal normal sphincter tone without palpated masses or tenderness.    Assessment/Plan:  51 y.o. G1P1 female for annual exam without menses, vasectomy birth control.   1. Amenorrhea. Last menses February 2017. No bleeding since. Is having some hot flashes and sweats. Borderline FSH 2 years ago. Discussed the perimenopause. Will monitor now and patient knows to follow up if she does any bleeding or if significant menopausal symptoms warrant gynecologic discussion. 2. Pap smear/HPV 06/2014. No Pap smear done today. No history of abnormal Pap smears previously. 3. Mammography 08/2016. Continue with annual mammography when due. SBE monthly reviewed. 4. Colonoscopy 2015. Repeat at their recommended interval. 5. Health  maintenance. No routine lab work done as patient reports this done elsewhere. Blood pressure 134/86 discussed. Patient notes it's not usually this high and will monitor it follow up with her primary if remains elevated. Follow up in one year, sooner as needed.   [name] MD, 10:56 AM 11/05/2016

## 2017-05-13 ENCOUNTER — Ambulatory Visit: Payer: Self-pay | Admitting: Gastroenterology

## 2017-11-12 ENCOUNTER — Encounter: Payer: Self-pay | Admitting: Gynecology

## 2017-11-12 ENCOUNTER — Ambulatory Visit (INDEPENDENT_AMBULATORY_CARE_PROVIDER_SITE_OTHER): Payer: 59 | Admitting: Gynecology

## 2017-11-12 VITALS — BP 130/82 | Ht 63.0 in | Wt 340.0 lb

## 2017-11-12 DIAGNOSIS — L723 Sebaceous cyst: Secondary | ICD-10-CM | POA: Diagnosis not present

## 2017-11-12 DIAGNOSIS — Z01411 Encounter for gynecological examination (general) (routine) with abnormal findings: Secondary | ICD-10-CM | POA: Diagnosis not present

## 2017-11-12 NOTE — Progress Notes (Signed)
    Crystal Wall 1966-01-12 [id number]        51 y.o.  G1P1 for annual gynecologic exam.  Doing well without gynecologic complaints.  Lost 40 pounds from last year.  Past medical history,surgical history, problem list, medications, allergies, family history and social history were all reviewed and documented as reviewed in the EPIC chart.  ROS:  Performed with pertinent positives and negatives included in the history, assessment and plan.   Additional significant findings : None   Exam: [name] assistant Vitals:   11/12/17 0825  BP: 130/82  Weight: (!) 340 lb (154.2 kg)  Height: 5\' 3"  (1.6 m)   Body mass index is 60.23 kg/m.  General appearance:  Normal affect, orientation and appearance. Skin: Grossly normal HEENT: Without gross lesions.  No cervical or supraclavicular adenopathy. Thyroid normal.  Lungs:  Clear without wheezing, rales or rhonchi Cardiac: RR, without RMG Abdominal:  Soft, nontender, without masses, guarding, rebound, organomegaly or hernia Breasts:  Examined lying and sitting without masses, retractions, discharge or axillary adenopathy. Pelvic:  Ext, BUS, Vagina: With small 1.5 cm sebaceous cyst left posterior fourchette/perineal body.  No overlying skin changes.  Cervix: Normal  Uterus: Able to palpate but no gross masses or tenderness  Adnexa: Without gross masses or tenderness  Anus and perineum: Normal   Rectovaginal: Normal sphincter tone without palpated masses or tenderness.    Assessment/Plan:  52 y.o. G1P1 female for annual gynecologic exam without menses, vasectomy birth control.   1. Postmenopausal.  Without menses over 1 year.  Occasional hot flushes but no significant sweats.  Follow-up if develops significant symptoms or has any bleeding. 2. Pap smear/HPV 06/2014.  No Pap smear done today.  No history of significant abnormal Pap smears.  Plan repeat Pap smear at 5-year interval per current screening guidelines. 3. Mammography 08/2017.   Continue with annual mammography when due.  Breast exam normal today. 4. Colonoscopy 2015.  Repeat at their recommended interval. 5. Health maintenance.  Is on a weight reduction program to include exercise and diet changes.  Congratulations provided.  No routine lab work done as patient does this elsewhere.  Follow-up 1 year, sooner as needed.   [name] MD, 9:00 AM 11/12/2017

## 2017-11-12 NOTE — Patient Instructions (Signed)
Follow-up in 1 year for annual exam, sooner if any issues. 

## 2018-04-14 ENCOUNTER — Ambulatory Visit (INDEPENDENT_AMBULATORY_CARE_PROVIDER_SITE_OTHER): Payer: 59 | Admitting: Gastroenterology

## 2018-04-14 ENCOUNTER — Encounter: Payer: Self-pay | Admitting: Gastroenterology

## 2018-04-14 VITALS — BP 106/68 | HR 56 | Ht 62.0 in | Wt 297.1 lb

## 2018-04-14 DIAGNOSIS — Z800 Family history of malignant neoplasm of digestive organs: Secondary | ICD-10-CM | POA: Diagnosis not present

## 2018-04-14 DIAGNOSIS — K625 Hemorrhage of anus and rectum: Secondary | ICD-10-CM | POA: Diagnosis not present

## 2018-04-14 DIAGNOSIS — Z9884 Bariatric surgery status: Secondary | ICD-10-CM | POA: Diagnosis not present

## 2018-04-14 DIAGNOSIS — D509 Iron deficiency anemia, unspecified: Secondary | ICD-10-CM | POA: Diagnosis not present

## 2018-04-14 NOTE — Progress Notes (Signed)
[facility] Consult Note:  History: Crystal Wall 04/14/2018  Referring physician: Jilda Panda, MD  Reason for consult/chief complaint: Family  Hx Of Colon Cancer (sister age 51); Rectal Bleeding (Dark blood in the toilet x 1 month ago); and Anemia (low iron)   Subjective  HPI:  This is a very pleasant 52 year old woman referred by primary care noted above for an episode of rectal bleeding that occurred about 6 weeks ago.  She was also sent for evaluation of chronic iron deficiency anemia. There was one episode of bleeding recently without abdominal or rectal pain.  She has had occasional constipation, no diarrhea.  The bleeding episode was described as dark and then some blood on the paper.  She has not had another episode since then.  Crystal Wall denies chronic abdominal pain, nausea, vomiting, and she has had some purposeful weight loss over this year. She underwent gastric bypass at [facility] years ago and has occasionally been on iron supplements but never IV iron and has not had hematology evaluation. Dr. Deatra Ina did a colonoscopy or her family history of colon cancer (sister, age 50) in January 2015, and only a single hyperplastic polyp was found.  ROS:  Review of Systems  Constitutional: Negative for appetite change and unexpected weight change.  HENT: Negative for mouth sores and voice change.   Eyes: Negative for pain and redness.  Respiratory: Negative for cough and shortness of breath.   Cardiovascular: Negative for chest pain and palpitations.  Genitourinary: Negative for dysuria and hematuria.  Musculoskeletal: Negative for arthralgias and myalgias.  Skin: Negative for pallor and rash.  Neurological: Negative for weakness and headaches.  Hematological: Negative for adenopathy.     Past Medical History: Past Medical History:  Diagnosis Date  . Allergic rhinitis   . B12 deficiency   . Hemorrhoids   . Hiatal hernia    as noted on barium swallow    . History of gestational diabetes   . Hyperplastic colon polyp   . Hypothyroidism   . Microcytic anemia   . Obesity   . Thyroid disease    hypothyroid     Past Surgical History: Past Surgical History:  Procedure Laterality Date  . CESAREAN SECTION  2001  . COLONOSCOPY N/A 10/24/2013   Procedure: COLONOSCOPY;  Surgeon: Inda Castle, MD;  Location: [facility];  Service: Endoscopy;  Laterality: N/A;  . GASTRIC BYPASS  2007  . HYSTEROSCOPY  08/2010     Family History: Family History  Problem Relation Age of Onset  . Heart disease Father        CHF  . Hypertension Father   . Prostate cancer Father   . Congestive Heart Failure Father   . Colon polyps Father   . Diabetes Sister   . Hypertension Sister   . Colon cancer Sister   . Uterine cancer Sister   . Hypertension Mother   . Colon polyps Mother   . Diabetes Maternal Grandmother     Social History: Social History   Socioeconomic History  . Marital status: Married    Spouse name: Not on file  . Number of children: 1  . Years of education: Not on file  . Highest education level: Not on file  Occupational History  . Not on file  Social Needs  . Financial resource strain: Not on file  . Food insecurity:    Worry: Not on file    Inability: Not on file  . Transportation needs:    Medical:  Not on file    Non-medical: Not on file  Tobacco Use  . Smoking status: Never Smoker  . Smokeless tobacco: Never Used  Substance and Sexual Activity  . Alcohol use: Yes    Comment: occassionally  . Drug use: No  . Sexual activity: Yes    Birth control/protection: Other-see comments    Comment: vasectomy-1st intercourse 52 yo-More than 5 partners  Lifestyle  . Physical activity:    Days per week: Not on file    Minutes per session: Not on file  . Stress: Not on file  Relationships  . Social connections:    Talks on phone: Not on file    Gets together: Not on file    Attends religious service: Not on file     Active member of club or organization: Not on file    Attends meetings of clubs or organizations: Not on file    Relationship status: Not on file  Other Topics Concern  . Not on file  Social History Narrative  . Not on file    Allergies: No Known Allergies  Outpatient Meds: Current Outpatient Medications  Medication Sig Dispense Refill  . Calcium Carbonate (CALCIUM 500 PO) Take 1 tablet by mouth 2 (two) times daily.      . cyanocobalamin (,VITAMIN B-12,) 1000 MCG/ML injection Inject 1,000 mcg into the muscle every 30 (thirty) days. At the beginning of the month    . ibuprofen (ADVIL,MOTRIN) 200 MG tablet Take 400 mg by mouth every 6 (six) hours as needed. For pain    . IRON PO Take by mouth.    . Multiple Vitamin (MULTIVITAMIN) tablet Take 1 tablet by mouth daily.      Marland Kitchen SYNTHROID 137 MCG tablet Take 137 mcg by mouth daily.  6   Current Facility-Administered Medications  Medication Dose Route Frequency Provider Last Rate Last Dose  . levonorgestrel (MIRENA) 20 MCG/24HR IUD   Intrauterine Once Fontaine, Belinda Block, MD          ___________________________________________________________________ Objective   Exam:  BP 106/68 (BP Location: Left Wrist, Patient Position: Sitting, Cuff Size: Normal)   Pulse (!) 56   Ht 5\' 2"  (1.575 m) Comment: Height measured without shoes  Wt 297 lb 2 oz (134.8 kg)   BMI 54.34 kg/m    General: this is a(n) pleasant and well-appearing woman  Eyes: sclera anicteric, no redness  ENT: oral mucosa moist without lesions, no cervical or supraclavicular lymphadenopathy, good dentition  CV: RRR without murmur, S1/S2, no JVD, no peripheral edema  Resp: clear to auscultation bilaterally, normal RR and effort noted  GI: soft, no tenderness, with active bowel sounds. No guarding -  no mass organomegaly appreciated, though limited by body habitus   Skin; warm and dry, no rash or jaundice noted  Neuro: awake, alert and oriented x 3. Normal gross  motor function and fluent speech  Labs:  04/02/18:  Hgb 11.0, nml MCV/RDW CMP normal   Assessment: Encounter Diagnoses  Name Primary?  . Rectal bleeding Yes  . Family history of colon cancer   . Iron deficiency anemia, unspecified iron deficiency anemia type   . 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  A hematology referral was placed to begin management with IV iron.  Single episode of bleeding that sounds most likely to have been hemorrhoidal.  Nevertheless, with her family history, and length of time since last colonoscopy, I advised her to have a colonoscopy.  It must be done in the hospital endoscopy lab due to her BMI, and I gave her my next available date in September.  She stated that she was unable to do that particular date and we have to consult to schedule and get back to [place] to set a date. Procedure and risks and benefits were described in detail and she was agreeable.  The benefits and risks of the planned procedure were described in detail with the patient or (when appropriate) their health care proxy.  Risks were outlined as including, but not limited to, bleeding, infection, perforation, adverse medication reaction leading to cardiac or pulmonary decompensation, or pancreatitis (if ERCP).  The limitation of incomplete mucosal visualization was also discussed.  No guarantees or warranties were given.  Thank you for the courtesy of this consult.  Please call me with any questions or concerns.  Nelida Meuse III  CC: Jilda Panda, MD

## 2018-04-14 NOTE — Patient Instructions (Signed)
If you are age 51 or older, your body mass index should be between 23-30. Your Body mass index is 54.34 kg/m. If this is out of the aforementioned range listed, please consider follow up with your Primary Care Provider.  If you are age 69 or younger, your body mass index should be between 19-25. Your Body mass index is 54.34 kg/m. If this is out of the aformentioned range listed, please consider follow up with your Primary Care Provider.   It has been recommended to you by your physician that you have a(n) Colonoscopy completed. Per your request, we did not schedule the procedure(s) today. Please contact our office at [phone] should you decide to have the procedure completed.   I am going to put in a recall to contact you in late Aug to schedule a colonoscopy.  It was a pleasure to meet you today!  Dr. Loletha Carrow

## 2018-04-23 ENCOUNTER — Telehealth: Payer: Self-pay

## 2018-04-23 NOTE — Telephone Encounter (Signed)
Referral sent to hematology on 04-14-2018. Will await appointment information. Outside labs were faxed to the office as requested.

## 2018-04-28 NOTE — Telephone Encounter (Signed)
Called and asked pt if she has heard from hematology. She is on  vacation this week, and will try to schedule next week.

## 2018-05-19 NOTE — Telephone Encounter (Signed)
Pt has not scheduled the appointment.

## 2018-05-19 NOTE — Telephone Encounter (Signed)
Left a message to return call.  

## 2018-05-19 NOTE — Telephone Encounter (Signed)
Let's try to make this easier.  Please see if she is interested in scheduling an IV iron treatment.  If so, please arrange an infusion of feraheme 510 mg once.  She is also still in need of the colonoscopy, and we were waiting to hear from her regarding her available dates for hospital scope (BMI > 50).   Try to reach her, if no answer, leave a message to call back at her convenience.

## 2018-05-20 ENCOUNTER — Encounter: Payer: Self-pay | Admitting: Nurse Practitioner

## 2018-05-20 ENCOUNTER — Telehealth: Payer: Self-pay | Admitting: Nurse Practitioner

## 2018-05-20 NOTE — Telephone Encounter (Signed)
Pt. returned call. She has scheduled her NP appointment with hematology. 06-02-2018 @ 130 pm.

## 2018-05-20 NOTE — Telephone Encounter (Signed)
New hematology referral received from Dr. Loletha Carrow at [facility] for [place]. Called and spoke to the pt to schedule an appt. Crystal Wall has been scheduled to see [name] on 8/28 at 130pm. Pt aware to arrive 30 minutes early. Letter mailed

## 2018-06-02 ENCOUNTER — Inpatient Hospital Stay: Payer: 59 | Attending: Nurse Practitioner | Admitting: Nurse Practitioner

## 2018-06-02 ENCOUNTER — Telehealth: Payer: Self-pay | Admitting: Hematology

## 2018-06-02 ENCOUNTER — Encounter: Payer: Self-pay | Admitting: Nurse Practitioner

## 2018-06-02 ENCOUNTER — Inpatient Hospital Stay: Payer: 59

## 2018-06-02 VITALS — BP 136/56 | HR 72 | Resp 18 | Ht 62.0 in | Wt 289.9 lb

## 2018-06-02 DIAGNOSIS — Z800 Family history of malignant neoplasm of digestive organs: Secondary | ICD-10-CM | POA: Diagnosis not present

## 2018-06-02 DIAGNOSIS — D509 Iron deficiency anemia, unspecified: Secondary | ICD-10-CM | POA: Diagnosis not present

## 2018-06-02 DIAGNOSIS — Z8371 Family history of colonic polyps: Secondary | ICD-10-CM | POA: Insufficient documentation

## 2018-06-02 DIAGNOSIS — Z9884 Bariatric surgery status: Secondary | ICD-10-CM | POA: Insufficient documentation

## 2018-06-02 DIAGNOSIS — E538 Deficiency of other specified B group vitamins: Secondary | ICD-10-CM | POA: Insufficient documentation

## 2018-06-02 DIAGNOSIS — K649 Unspecified hemorrhoids: Secondary | ICD-10-CM | POA: Insufficient documentation

## 2018-06-02 DIAGNOSIS — Z86010 Personal history of colonic polyps: Secondary | ICD-10-CM | POA: Insufficient documentation

## 2018-06-02 DIAGNOSIS — Z8042 Family history of malignant neoplasm of prostate: Secondary | ICD-10-CM | POA: Diagnosis not present

## 2018-06-02 DIAGNOSIS — D519 Vitamin B12 deficiency anemia, unspecified: Secondary | ICD-10-CM | POA: Diagnosis not present

## 2018-06-02 LAB — CBC WITH DIFFERENTIAL (CANCER CENTER ONLY)
BASOS ABS: 0.1 10*3/uL (ref 0.0–0.1)
Basophils Relative: 1 %
Eosinophils Absolute: 0.1 10*3/uL (ref 0.0–0.5)
Eosinophils Relative: 2 %
HCT: 36.3 % (ref 34.8–46.6)
HEMOGLOBIN: 11.6 g/dL (ref 11.6–15.9)
LYMPHS ABS: 1.6 10*3/uL (ref 0.9–3.3)
Lymphocytes Relative: 26 %
MCH: 25.6 pg (ref 25.1–34.0)
MCHC: 32.1 g/dL (ref 31.5–36.0)
MCV: 79.9 fL (ref 79.5–101.0)
Monocytes Absolute: 0.3 10*3/uL (ref 0.1–0.9)
Monocytes Relative: 6 %
NEUTROS PCT: 65 %
Neutro Abs: 4 10*3/uL (ref 1.5–6.5)
Platelet Count: 229 10*3/uL (ref 145–400)
RBC: 4.54 MIL/uL (ref 3.70–5.45)
RDW: 17.1 % — ABNORMAL HIGH (ref 11.2–14.5)
WBC: 6.2 10*3/uL (ref 3.9–10.3)

## 2018-06-02 LAB — VITAMIN B12: Vitamin B-12: 457 pg/mL (ref 180–914)

## 2018-06-02 NOTE — Patient Instructions (Signed)

## 2018-06-02 NOTE — Telephone Encounter (Signed)
Appts scheduled patient declined AVS/Calendar due to my chart access per 8/28 los

## 2018-06-02 NOTE — Progress Notes (Addendum)
[place]  [phone]) [phone] Fax:([phone]) [place] consult Note   Patient Care Team: Jilda Panda, MD as PCP - General (Internal Medicine) 06/02/2018  CHIEF COMPLAINTS/PURPOSE OF CONSULTATION:  Iron deficiency anemia   HISTORY OF PRESENTING ILLNESS:  Crystal Wall 52 y.o. female is here because of chronic iron deficiency anemia. She was referred by gastroenterologist Dr. Loletha Carrow who saw her initially on 04/14/18 for 1 episode of rectal bleeding, anemia, and family history of colon cancer. Last colonoscopy per Dr. Deatra Ina on 10/24/2013 showed 1 hyperplastic polyp in the descending colon but otherwise normal colon. Per outside records she had microcytic, hypochromic anemia with Hgb 9.7 in 12/2013. At her recent visit Dr. Loletha Carrow recommended to repeat colonoscopy, date is pending.   Past medical history includes gastric bypass at [facility] in 2007, iron deficiency, B12 deficiency, hiatal hernia, hypothyroidism, obesity, and hemorrhoids. Per outside records, ferritin was 6 in 07/2016; most recent CBC with Hgb 11.0 on 04/02/2018. She has been on oral iron inconsistently for many years and has been receiving monthly B12 injection for many years as well. She was forgetful with oral iron but tolerated it fairly well with mild constipation. She never received IV iron therapy. She has never received blood transfusion. She is not a vegetarian, eats a well-rounded diet high in vegetables. Does report pica with ice.   She denies recent chest pain on exertion, shortness of breath on minimal exertion, pre-syncopal episodes, or palpitations. She had not noticed any recent bleeding such as epistaxis or hematuria. She has occasional hematochezia which she attributes to hemorrhoids. She denies over the counter NSAID ingestion. She is not on antiplatelets agents. She has not had menses in approx 1 year, previously had regular menses that were heavy for 1 day. Not on hormonal therapy.   She had  no prior history or diagnosis of cancer. Her age appropriate screening programs are up-to-date. Last PAP in 2015 was negative. Mammogram on 08/2017 at [facility] was negative for malignancy. Her sister had colon and uterine cancer, father had polyps and prostate cancer, mother also had polyps.   Today she feels well. Denies fatigue or unexplained weight loss. She has intentionally lost 98 lbs in 1 year. She has occasional night sweat/hot flash related to menopause. Has normal BM every other day. She has occasional blood in stool related to hemorrhoids. No n/v/c/d. No recent fever, chills, cough, chest pain, dyspnea, or pain.   MEDICAL HISTORY:  Past Medical History:  Diagnosis Date  . Allergic rhinitis   . B12 deficiency   . Hemorrhoids   . Hiatal hernia    as noted on barium swallow  . History of gestational diabetes   . Hyperplastic colon polyp   . Hypothyroidism   . Microcytic anemia   . Obesity   . Thyroid disease    hypothyroid    SURGICAL HISTORY: Past Surgical History:  Procedure Laterality Date  . CESAREAN SECTION  2001  . COLONOSCOPY N/A 10/24/2013   Procedure: COLONOSCOPY;  Surgeon: Inda Castle, MD;  Location: [facility];  Service: Endoscopy;  Laterality: N/A;  . GASTRIC BYPASS  2007  . HYSTEROSCOPY  08/2010    SOCIAL HISTORY: Social History   Socioeconomic History  . Marital status: Married    Spouse name: Not on file  . Number of children: 1  . Years of education: Not on file  . Highest education level: Not on file  Occupational History  . Not on file  Social Needs  .  Financial resource strain: Not on file  . Food insecurity:    Worry: Not on file    Inability: Not on file  . Transportation needs:    Medical: Not on file    Non-medical: Not on file  Tobacco Use  . Smoking status: Never Smoker  . Smokeless tobacco: Never Used  Substance and Sexual Activity  . Alcohol use: Yes    Comment: occassionally  . Drug use: No  . Sexual activity: Yes     Birth control/protection: Other-see comments    Comment: vasectomy-1st intercourse 52 yo-More than 5 partners  Lifestyle  . Physical activity:    Days per week: Not on file    Minutes per session: Not on file  . Stress: Not on file  Relationships  . Social connections:    Talks on phone: Not on file    Gets together: Not on file    Attends religious service: Not on file    Active member of club or organization: Not on file    Attends meetings of clubs or organizations: Not on file    Relationship status: Not on file  . Intimate partner violence:    Fear of current or ex partner: Not on file    Emotionally abused: Not on file    Physically abused: Not on file    Forced sexual activity: Not on file  Other Topics Concern  . Not on file  Social History Narrative  . Not on file    FAMILY HISTORY: Family History  Problem Relation Age of Onset  . Heart disease Father        CHF  . Hypertension Father   . Prostate cancer Father   . Congestive Heart Failure Father   . Colon polyps Father   . Diabetes Sister   . Hypertension Sister   . Colon cancer Sister   . Uterine cancer Sister   . Hypertension Mother   . Colon polyps Mother   . Diabetes Maternal Grandmother     ALLERGIES:  has No Known Allergies.  MEDICATIONS:  Current Outpatient Medications  Medication Sig Dispense Refill  . Calcium Carbonate (CALCIUM 500 PO) Take 1 tablet by mouth 2 (two) times daily.      . cyanocobalamin (,VITAMIN B-12,) 1000 MCG/ML injection Inject 1,000 mcg into the muscle every 30 (thirty) days. At the beginning of the month    . ibuprofen (ADVIL,MOTRIN) 200 MG tablet Take 400 mg by mouth every 6 (six) hours as needed. For pain    . IRON PO Take by mouth.    . Multiple Vitamin (MULTIVITAMIN) tablet Take 1 tablet by mouth daily.      Marland Kitchen SYNTHROID 137 MCG tablet Take 137 mcg by mouth daily.  6   Current Facility-Administered Medications  Medication Dose Route Frequency Provider Last Rate Last  Dose  . levonorgestrel (MIRENA) 20 MCG/24HR IUD   Intrauterine Once Fontaine, Belinda Block, MD        REVIEW OF SYSTEMS:   Constitutional: Denies fatigue, fevers, chills (+) occasional night sweat/hot flash r/t menopause (+) intentional weight loss  Ears, nose, mouth, throat, and face: Denies mucositis or sore throat Respiratory: Denies cough, dyspnea or wheezes Cardiovascular: Denies palpitation, chest discomfort or lower extremity swelling Gastrointestinal:  Denies nausea, vomiting, constipation, diarrhea, heartburn or change in bowel habits (+) occasional bleeding hemorrhoid Skin: Denies abnormal skin rashes Lymphatics: Denies new lymphadenopathy or easy bruising Neurological:Denies numbness, tingling or new weaknesses Behavioral/Psych: Mood is stable, no new changes  All other systems were reviewed with the patient and are negative.  PHYSICAL EXAMINATION: ECOG PERFORMANCE STATUS: 0 - Asymptomatic  Vitals:   06/02/18 1336  BP: (!) 136/56  Pulse: 72  Resp: 18  SpO2: 99%   Filed Weights   06/02/18 1336  Weight: 289 lb 14.4 oz (131.5 kg)    GENERAL:alert, no distress and comfortable SKIN: skin color, texture, turgor are normal, no rashes or significant lesions EYES: sclera clear OROPHARYNX:no thrush or ulcers LYMPH:  no palpable cervical or supraclavicular lymphadenopathy  LUNGS: clear to auscultation with normal breathing effort HEART: regular rate & rhythm and no murmurs and no lower extremity edema ABDOMEN:abdomen soft, non-tender and normal bowel sounds Musculoskeletal:no cyanosis of digits and no clubbing  PSYCH: alert & oriented x 3 with fluent speech NEURO: no focal motor/sensory deficits  LABORATORY DATA:  I have reviewed the data as listed CBC Latest Ref Rng & Units 06/02/2018 06/29/2014 01/03/2014  WBC 3.9 - 10.3 K/uL 6.2 7.1 6.3  Hemoglobin 11.6 - 15.9 g/dL 11.6 9.1(L) 9.7(L)  Hematocrit 34.8 - 46.6 % 36.3 29.8(L) 31.7(L)  Platelets 145 - 400 K/uL 229 317 290      RADIOGRAPHIC STUDIES: I have personally reviewed the radiological images as listed and agreed with the findings in the report. No results found.  ASSESSMENT & PLAN: 52 yo caucasian female with chronic iron deficiency and B12 deficiency anemia with history of gastric bypass surgery in 2007  1. Iron deficiency, B12 deficiency with mild anemia  -we reviewed her medical record in detail with the patient. She has intermittent mild anemia and chronic iron deficiency. I do not have recent outside records to compare.  -Dr. Burr Medico reviewed various causes of anemia and nutritional deficiencies.  -We recommend she pursue routine colonoscopy per Dr. Loletha Carrow. If no significant bleeding, her iron deficiency is most likely related to malabsorption secondary to gastric bypass surgery.  -Her CBC from today shows no anemia, Hgb 11.6; iron studies and B12 are pending. If her iron studies are low, will arrange IV Feraheme for 1 or 2 doses. If she has borderline iron deficiency, we would recommend to restart oral iron daily.  -side effects of IV Feraheme were reviewed, including constipation, headache, dark stool, and hypersensitivity reaction including rare but serious anaphylaxis. She agrees to proceed if needed.  -She continues monthly B12 injection, her spouse performs this at home. -will repeat labs in 6 weeks to evaluate her response to iron therapy, then every 3 months x2 -She will return for f/u with Dr. Burr Medico in 6 months     Orders Placed This Encounter  Procedures  . CBC with Differential (Cancer Center Only)    Standing Status:   Standing    Number of Occurrences:   20    Standing Expiration Date:   06/02/2024  . Iron and TIBC    Standing Status:   Standing    Number of Occurrences:   20    Standing Expiration Date:   06/02/2024  . Ferritin    Standing Status:   Standing    Number of Occurrences:   20    Standing Expiration Date:   06/02/2024  . Vitamin B12    Standing Status:   Standing     Number of Occurrences:   20    Standing Expiration Date:   06/02/2024    All questions were answered. The patient knows to call the clinic with any problems, questions or concerns. I spent 45 minutes counseling the patient  face to face. The total time spent in the appointment was 60 minutes and more than 50% was on counseling, review of test results, and coordination of care.     Alla Feeling, NP 06/02/18   Addendum  I have seen the patient, examined her. I agree with the assessment and and plan and have edited the notes.   Ms Cavan is a 52 yo female with PMH of morbid obesity, status post gastric bypass surgery, chronic mild anemia with iron deficiency and B12 deficiency, was referred to our clinic to consider IV iron.  I have reviewed her outside lab results, and suggested her to repeat CBC, and iron study today.  Her iron deficiency is likely related to her gastric bypass surgery, no high suspicion for GI bleeding.  She was recently seen by gastroenterologist Dr. Loletha Carrow, and plans to have a repeated colonoscopy.  We discussed the benefits and potential side effect of IV iron, such as Feraheme, especially allergy reactions including anaphylactic reaction.  She voiced good understanding and agrees to proceed if needed.  She is not a very compliant with oral iron, but agrees to try daily.  She is on PPI, which will inhibit her iron absorption also.  Will monitor her labs every 3 months, plan to see her back in 6 months.  Truitt Merle  06/02/2018

## 2018-06-03 LAB — IRON AND TIBC
Iron: 23 ug/dL — ABNORMAL LOW (ref 41–142)
Saturation Ratios: 5 % — ABNORMAL LOW (ref 21–57)
TIBC: 439 ug/dL (ref 236–444)
UIBC: 416 ug/dL

## 2018-06-03 LAB — FERRITIN: Ferritin: 9 ng/mL — ABNORMAL LOW (ref 11–307)

## 2018-06-04 ENCOUNTER — Telehealth: Payer: Self-pay | Admitting: Nurse Practitioner

## 2018-06-04 ENCOUNTER — Other Ambulatory Visit: Payer: Self-pay | Admitting: Nurse Practitioner

## 2018-06-04 DIAGNOSIS — D509 Iron deficiency anemia, unspecified: Secondary | ICD-10-CM | POA: Insufficient documentation

## 2018-06-04 DIAGNOSIS — D508 Other iron deficiency anemias: Secondary | ICD-10-CM

## 2018-06-04 NOTE — Telephone Encounter (Signed)
I called patient to discuss labs results. She is not anemic, but ferritin is quite low as well as other iron studies. She has not been consistent with po iron in the past. I recommend IV Feraheme weekly x2, she agrees. Will monitor labs routinely, and repeat IV Feraheme as needed. She understands and agrees to the plan. Will place order, submit for insurance approval, and send scheduling message.   [name], AGNP-C 06/04/18

## 2018-06-10 ENCOUNTER — Telehealth: Payer: Self-pay | Admitting: Hematology

## 2018-06-10 NOTE — Telephone Encounter (Signed)
Called regarding sch msg patient could not do 9/16 so I did 9/17

## 2018-06-14 ENCOUNTER — Inpatient Hospital Stay: Payer: 59 | Attending: Nurse Practitioner

## 2018-06-14 VITALS — BP 137/55 | HR 63 | Temp 98.3°F | Resp 16

## 2018-06-14 DIAGNOSIS — D509 Iron deficiency anemia, unspecified: Secondary | ICD-10-CM | POA: Insufficient documentation

## 2018-06-14 DIAGNOSIS — D508 Other iron deficiency anemias: Secondary | ICD-10-CM

## 2018-06-14 MED ORDER — SODIUM CHLORIDE 0.9 % IV SOLN
510.0000 mg | Freq: Once | INTRAVENOUS | Status: AC
  Administered 2018-06-14: 510 mg via INTRAVENOUS
  Filled 2018-06-14: qty 17

## 2018-06-14 MED ORDER — SODIUM CHLORIDE 0.9 % IV SOLN
Freq: Once | INTRAVENOUS | Status: AC
  Administered 2018-06-14: 08:00:00 via INTRAVENOUS
  Filled 2018-06-14: qty 250

## 2018-06-14 NOTE — Patient Instructions (Signed)

## 2018-06-21 ENCOUNTER — Ambulatory Visit: Payer: 59

## 2018-06-22 ENCOUNTER — Inpatient Hospital Stay: Payer: 59

## 2018-06-22 VITALS — BP 120/51 | HR 43 | Temp 97.8°F | Resp 16

## 2018-06-22 DIAGNOSIS — D508 Other iron deficiency anemias: Secondary | ICD-10-CM

## 2018-06-22 DIAGNOSIS — D509 Iron deficiency anemia, unspecified: Secondary | ICD-10-CM | POA: Diagnosis not present

## 2018-06-22 MED ORDER — SODIUM CHLORIDE 0.9 % IV SOLN
Freq: Once | INTRAVENOUS | Status: AC
  Administered 2018-06-22: 09:00:00 via INTRAVENOUS
  Filled 2018-06-22: qty 250

## 2018-06-22 MED ORDER — SODIUM CHLORIDE 0.9 % IV SOLN
510.0000 mg | Freq: Once | INTRAVENOUS | Status: AC
  Administered 2018-06-22: 510 mg via INTRAVENOUS
  Filled 2018-06-22: qty 17

## 2018-06-22 NOTE — Progress Notes (Signed)
Patient refused to stay 30 minute post observation. Feraheme vitals stable at discharge

## 2018-06-22 NOTE — Patient Instructions (Signed)

## 2018-06-28 ENCOUNTER — Telehealth: Payer: Self-pay

## 2018-06-28 NOTE — Telephone Encounter (Signed)
Left a message to return call. Needs a colon at [facility]. Opening on 10 7-19 @ 10 am.

## 2018-06-28 NOTE — Telephone Encounter (Signed)
Pt is returning your call and said she cannot schedule for 07-12-18.

## 2018-07-02 ENCOUNTER — Other Ambulatory Visit: Payer: Self-pay

## 2018-07-02 DIAGNOSIS — Z9884 Bariatric surgery status: Secondary | ICD-10-CM

## 2018-07-02 DIAGNOSIS — Z800 Family history of malignant neoplasm of digestive organs: Secondary | ICD-10-CM

## 2018-07-02 DIAGNOSIS — D509 Iron deficiency anemia, unspecified: Secondary | ICD-10-CM

## 2018-07-02 DIAGNOSIS — K625 Hemorrhage of anus and rectum: Secondary | ICD-10-CM

## 2018-07-02 NOTE — Telephone Encounter (Signed)
Colonoscopy at [place] is scheduled for 08-10-2018 @ 830 am. Instructions have been mailed to her home address.

## 2018-07-02 NOTE — Addendum Note (Signed)
Addended by: Elias Else on: 07/02/2018 03:59 PM   Modules accepted: Orders

## 2018-07-21 ENCOUNTER — Inpatient Hospital Stay: Payer: 59 | Attending: Nurse Practitioner

## 2018-07-21 DIAGNOSIS — D509 Iron deficiency anemia, unspecified: Secondary | ICD-10-CM | POA: Diagnosis not present

## 2018-07-21 LAB — CBC WITH DIFFERENTIAL (CANCER CENTER ONLY)
ABS IMMATURE GRANULOCYTES: 0.01 10*3/uL (ref 0.00–0.07)
Basophils Absolute: 0 10*3/uL (ref 0.0–0.1)
Basophils Relative: 1 %
Eosinophils Absolute: 0.1 10*3/uL (ref 0.0–0.5)
Eosinophils Relative: 2 %
HCT: 43.1 % (ref 36.0–46.0)
HEMOGLOBIN: 13.8 g/dL (ref 12.0–15.0)
Immature Granulocytes: 0 %
LYMPHS PCT: 28 %
Lymphs Abs: 1.5 10*3/uL (ref 0.7–4.0)
MCH: 28.2 pg (ref 26.0–34.0)
MCHC: 32 g/dL (ref 30.0–36.0)
MCV: 88 fL (ref 80.0–100.0)
MONO ABS: 0.2 10*3/uL (ref 0.1–1.0)
MONOS PCT: 4 %
NEUTROS ABS: 3.5 10*3/uL (ref 1.7–7.7)
Neutrophils Relative %: 65 %
Platelet Count: 208 10*3/uL (ref 150–400)
RBC: 4.9 MIL/uL (ref 3.87–5.11)
RDW: 19.8 % — ABNORMAL HIGH (ref 11.5–15.5)
WBC Count: 5.5 10*3/uL (ref 4.0–10.5)
nRBC: 0 % (ref 0.0–0.2)

## 2018-07-21 LAB — FERRITIN: FERRITIN: 98 ng/mL (ref 11–307)

## 2018-07-21 LAB — IRON AND TIBC
IRON: 78 ug/dL (ref 41–142)
Saturation Ratios: 25 % (ref 21–57)
TIBC: 318 ug/dL (ref 236–444)
UIBC: 239 ug/dL

## 2018-07-22 ENCOUNTER — Telehealth: Payer: Self-pay

## 2018-07-22 NOTE — Telephone Encounter (Signed)
-----   Message from Alla Feeling, NP sent at 07/21/2018  2:17 PM EDT ----- Please let her know iron level is normal, she had good response to Feraheme. Continue oral iron. Please move next lab and f/u with Dr. Burr Medico to 11/2018. Thanks, Regan Rakers NP

## 2018-07-22 NOTE — Telephone Encounter (Signed)
Spoke with patient with lab results, per [name] NP iron level is normal, good response to Feraheme.  Continue taking oral iron.  Informed her we will move her next lab and f/u with Dr. Burr Medico to 11/2018, scheduling will call with appointments.

## 2018-07-22 NOTE — Telephone Encounter (Signed)
Let patient know lab results good response to feraheme.  We will move lab and f/u to 2/20 patient verbalized an understanding.

## 2018-07-30 ENCOUNTER — Telehealth: Payer: Self-pay | Admitting: Gastroenterology

## 2018-07-30 NOTE — Telephone Encounter (Signed)
Instructions have been re mailed to address on file.

## 2018-07-30 NOTE — Telephone Encounter (Signed)
Patient is scheduled at the hosp for a procedure wants to know her instructions, has not received anything regarding prep

## 2018-08-02 ENCOUNTER — Other Ambulatory Visit: Payer: Self-pay

## 2018-08-02 MED ORDER — PEG-KCL-NACL-NASULF-NA ASC-C 140 G PO SOLR: 140.0000 g | ORAL | 0 refills | Status: DC

## 2018-08-02 NOTE — Telephone Encounter (Signed)
Patient states she still needs prep sent to [facility]. Pt last seen 07.10.19 for ov

## 2018-08-02 NOTE — Telephone Encounter (Signed)
Sent to the pharmacy on file

## 2018-08-09 ENCOUNTER — Other Ambulatory Visit: Payer: Self-pay

## 2018-08-09 ENCOUNTER — Encounter (HOSPITAL_COMMUNITY): Payer: Self-pay | Admitting: *Deleted

## 2018-08-09 NOTE — Anesthesia Preprocedure Evaluation (Addendum)
Anesthesia Evaluation  Patient identified by MRN, date of birth, ID band Patient awake    Reviewed: Allergy & Precautions, NPO status , Patient's Chart, lab work & pertinent test results  Airway Mallampati: II  TM Distance: >3 FB Neck ROM: Full    Dental no notable dental hx. (+) Teeth Intact   Pulmonary neg pulmonary ROS,    Pulmonary exam normal breath sounds clear to auscultation       Cardiovascular Normal cardiovascular exam Rhythm:Regular Rate:Normal     Neuro/Psych  Neuromuscular disease negative psych ROS   GI/Hepatic Neg liver ROS, hiatal hernia, GERD  Medicated and Controlled,Hx/o colon polyps Hx/o roux en y gastric bypass Rectal bleeding   Endo/Other  Hypothyroidism Morbid obesity  Renal/GU negative Renal ROS  negative genitourinary   Musculoskeletal negative musculoskeletal ROS (+)   Abdominal (+) + obese,   Peds  Hematology  (+) anemia ,   Anesthesia Other Findings   Reproductive/Obstetrics                            Anesthesia Physical Anesthesia Plan  ASA: III  Anesthesia Plan: MAC   Post-op Pain Management:    Induction: Intravenous  PONV Risk Score and Plan: 2 and Ondansetron, Propofol infusion and Treatment may vary due to age or medical condition  Airway Management Planned: Natural Airway, Simple Face Mask and Nasal Cannula  Additional Equipment:   Intra-op Plan:   Post-operative Plan:   Informed Consent: I have reviewed the patients History and Physical, chart, labs and discussed the procedure including the risks, benefits and alternatives for the proposed anesthesia with the patient or authorized representative who has indicated his/her understanding and acceptance.   Dental advisory given  Plan Discussed with: CRNA and Surgeon  Anesthesia Plan Comments:        Anesthesia Quick Evaluation

## 2018-08-10 ENCOUNTER — Encounter (HOSPITAL_COMMUNITY)
Admission: RE | Disposition: A | Discharge status: Home / Self Care | Payer: Self-pay | Source: Ambulatory Visit | Attending: Gastroenterology

## 2018-08-10 ENCOUNTER — Ambulatory Visit (HOSPITAL_COMMUNITY): Payer: 59 | Admitting: Anesthesiology

## 2018-08-10 ENCOUNTER — Ambulatory Visit (HOSPITAL_COMMUNITY)
Admission: RE | Admit: 2018-08-10 | Discharge: 2018-08-10 | Disposition: A | Discharge status: Home / Self Care | Payer: 59 | Source: Ambulatory Visit | Attending: Gastroenterology | Admitting: Gastroenterology

## 2018-08-10 ENCOUNTER — Other Ambulatory Visit: Payer: Self-pay

## 2018-08-10 ENCOUNTER — Encounter (HOSPITAL_COMMUNITY): Payer: Self-pay | Admitting: Gastroenterology

## 2018-08-10 ENCOUNTER — Encounter: Payer: Self-pay | Admitting: Nurse Practitioner

## 2018-08-10 DIAGNOSIS — K624 Stenosis of anus and rectum: Secondary | ICD-10-CM | POA: Insufficient documentation

## 2018-08-10 DIAGNOSIS — K625 Hemorrhage of anus and rectum: Secondary | ICD-10-CM | POA: Diagnosis present

## 2018-08-10 DIAGNOSIS — Z6841 Body Mass Index (BMI) 40.0 and over, adult: Secondary | ICD-10-CM | POA: Insufficient documentation

## 2018-08-10 DIAGNOSIS — K635 Polyp of colon: Secondary | ICD-10-CM | POA: Diagnosis not present

## 2018-08-10 DIAGNOSIS — E039 Hypothyroidism, unspecified: Secondary | ICD-10-CM | POA: Diagnosis not present

## 2018-08-10 DIAGNOSIS — Z9884 Bariatric surgery status: Secondary | ICD-10-CM | POA: Diagnosis not present

## 2018-08-10 DIAGNOSIS — Z79899 Other long term (current) drug therapy: Secondary | ICD-10-CM | POA: Diagnosis not present

## 2018-08-10 DIAGNOSIS — D120 Benign neoplasm of cecum: Secondary | ICD-10-CM | POA: Insufficient documentation

## 2018-08-10 DIAGNOSIS — Z800 Family history of malignant neoplasm of digestive organs: Secondary | ICD-10-CM

## 2018-08-10 DIAGNOSIS — D509 Iron deficiency anemia, unspecified: Secondary | ICD-10-CM

## 2018-08-10 HISTORY — PX: COLONOSCOPY WITH PROPOFOL: SHX5780

## 2018-08-10 HISTORY — PX: POLYPECTOMY: SHX5525

## 2018-08-10 SURGERY — ESOPHAGOGASTRODUODENOSCOPY (EGD) WITH PROPOFOL
Anesthesia: Monitor Anesthesia Care

## 2018-08-10 SURGERY — COLONOSCOPY WITH PROPOFOL
Anesthesia: Monitor Anesthesia Care

## 2018-08-10 MED ORDER — PROPOFOL 500 MG/50ML IV EMUL
INTRAVENOUS | Status: DC | PRN
  Administered 2018-08-10: 125 ug/kg/min via INTRAVENOUS

## 2018-08-10 MED ORDER — PROPOFOL 10 MG/ML IV BOLUS
INTRAVENOUS | Status: AC
  Filled 2018-08-10: qty 60

## 2018-08-10 MED ORDER — LACTATED RINGERS IV SOLN
INTRAVENOUS | Status: DC
  Administered 2018-08-10: 08:00:00 via INTRAVENOUS

## 2018-08-10 MED ORDER — PROPOFOL 10 MG/ML IV BOLUS
INTRAVENOUS | Status: DC | PRN
  Administered 2018-08-10 (×2): 20 mg via INTRAVENOUS

## 2018-08-10 MED ORDER — LIDOCAINE 2% (20 MG/ML) 5 ML SYRINGE
INTRAMUSCULAR | Status: DC | PRN
  Administered 2018-08-10: 80 mg via INTRAVENOUS

## 2018-08-10 MED ORDER — SODIUM CHLORIDE 0.9 % IV SOLN: INTRAVENOUS | Status: DC

## 2018-08-10 SURGICAL SUPPLY — 21 items

## 2018-08-10 NOTE — H&P (Signed)
History:  This patient presents for endoscopic testing for rectal bleeding.  Marko Plume Referring physician: Jilda Panda, MD  Past Medical History: Past Medical History:  Diagnosis Date  . Allergic rhinitis   . B12 deficiency   . Hemorrhoids   . Hiatal hernia    as noted on barium swallow  . History of gestational diabetes   . Hyperplastic colon polyp   . Hypothyroidism   . Microcytic anemia   . Obesity   . Thyroid disease    hypothyroid   Office visit with me Jul 2019 - Patient has since been seen by hematology and had IV iron  Past Surgical History: Past Surgical History:  Procedure Laterality Date  . CESAREAN SECTION  2001  . COLONOSCOPY N/A 10/24/2013   Procedure: COLONOSCOPY;  Surgeon: Inda Castle, MD;  Location: [facility];  Service: Endoscopy;  Laterality: N/A;  . GASTRIC BYPASS  2007  . HYSTEROSCOPY  08/2010    Allergies: No Known Allergies  Outpatient Meds: Current Facility-Administered Medications  Medication Dose Route Frequency Provider Last Rate Last Dose  . 0.9 %  sodium chloride infusion   Intravenous Continuous Danis, Estill Cotta III, MD      . lactated ringers infusion   Intravenous Continuous Nelida Meuse III, MD 10 mL/hr at 08/10/18 0742        ___________________________________________________________________ Objective   Exam:  BP (!) 122/50   Pulse (!) 44   Temp 98.3 F (36.8 C) (Oral)   Resp 17   Ht 5\' 2"  (1.575 m)   Wt 127 kg   SpO2 99%   BMI 51.21 kg/m    CV: RRR without murmur, S1/S2, no JVD, no peripheral edema  Resp: clear to auscultation bilaterally, normal RR and effort noted  GI: soft, no tenderness, with active bowel sounds. No guarding or palpable organomegaly noted.  Neuro: awake, alert and oriented x 3. Normal gross motor function and fluent speech   Assessment:  Rectal bleeding Family history of colon cancer  Plan:  colonoscopy   Nelida Meuse III

## 2018-08-10 NOTE — Interval H&P Note (Signed)
History and Physical Interval Note:  08/10/2018 8:34 AM  Crystal Wall  has presented today for surgery, with the diagnosis of IDA  The various methods of treatment have been discussed with the patient and family. After consideration of risks, benefits and other options for treatment, the patient has consented to  Procedure(s): COLONOSCOPY WITH PROPOFOL (N/A) as a surgical intervention .  The patient's history has been reviewed, patient examined, no change in status, stable for surgery.  I have reviewed the patient's chart and labs.  Questions were answered to the patient's satisfaction.     Nelida Meuse III

## 2018-08-10 NOTE — Anesthesia Procedure Notes (Signed)
Procedure Name: MAC Date/Time: 08/10/2018 8:44 AM Performed by: Dione Booze, CRNA Pre-anesthesia Checklist: Patient identified, Emergency Drugs available, Suction available and Patient being monitored Patient Re-evaluated:Patient Re-evaluated prior to induction Oxygen Delivery Method: Simple face mask Placement Confirmation: positive ETCO2

## 2018-08-10 NOTE — Op Note (Signed)
[facility] Patient Name: Crystal Wall Procedure Date: 08/10/2018 MRN: [id number] Attending MD: Estill Cotta. Crystal Wall , MD Date of Birth: Mar 04, 1966 CSN: [id number] Age: 52 Admit Type: Outpatient Procedure:                Colonoscopy Indications:              Rectal bleeding (single episode) Providers:                Estill Cotta. Crystal Carrow, MD, Cleda Daub, RN, Cletis Athens,                            Technician, Dione Booze, CRNA Referring MD:              Medicines:                Monitored Anesthesia Care Complications:            No immediate complications. Estimated Blood Loss:     Estimated blood loss was minimal. Procedure:                Pre-Anesthesia Assessment:                           - Prior to the procedure, a History and Physical                            was performed, and patient medications and                            allergies were reviewed. The patient's tolerance of                            previous anesthesia was also reviewed. The risks                            and benefits of the procedure and the sedation                            options and risks were discussed with the patient.                            All questions were answered, and informed consent                            was obtained. Prior Anticoagulants: The patient has                            taken no previous anticoagulant or antiplatelet                            agents. ASA Grade Assessment: III - A patient with                            severe systemic disease. After reviewing the risks  and benefits, the patient was deemed in                            satisfactory condition to undergo the procedure.                           After obtaining informed consent, the colonoscope                            was passed under direct vision. Throughout the                            procedure, the patient's blood pressure, pulse, and               oxygen saturations were monitored continuously. The                            CF-HQ190L ([id number] Olympus adult colonoscope was                            introduced through the anus and advanced to the the                            cecum, identified by appendiceal orifice and                            ileocecal valve. The colonoscopy was performed                            without difficulty. The patient tolerated the                            procedure well. The quality of the bowel                            preparation was good. The ileocecal valve,                            appendiceal orifice, and rectum were photographed.                            The quality of the bowel preparation was evaluated                            using the BBPS [facility] Bowel Preparation Scale)                            with scores of: Right Colon = 2, Transverse Colon =                            2 and Left Colon = 2. The total BBPS score equals 6. Scope In: 8:47:54 AM Scope Out: 9:14:30 AM Scope Withdrawal Time: 0 hours 18 minutes 8 seconds  Total Procedure Duration: 0 hours 26 minutes 36 seconds  Findings:      The digital rectal exam findings include a mild anal stricture.      A 15 mm polyp was found in the cecum. The polyp was carpet-like and       sessile. The polyp was removed with a piecemeal technique using a cold       snare. Resection and retrieval were complete. To prevent bleeding       post-intervention, one hemostatic clip was successfully placed (MR       conditional).      The exam was otherwise without abnormality on direct and retroflexion       views. Impression:               - Anal stricture found on digital rectal exam.                           - One 15 mm polyp in the cecum, removed piecemeal                            using a cold snare. Resected and retrieved. Clip                            (MR conditional) was placed.                           - The  examination was otherwise normal on direct                            and retroflexion views.                           Benign anal bleeding. Moderate Sedation:      MAC sedation used Recommendation:           - Patient has a contact number available for                            emergencies. The signs and symptoms of potential                            delayed complications were discussed with the                            patient. Return to normal activities tomorrow.                            Written discharge instructions were provided to the                            patient.                           - Resume previous diet.                           - Continue present medications.                           -  Await pathology results.                           - Repeat colonoscopy is recommended for                            surveillance. The colonoscopy date will be                            determined after pathology results from today's                            exam become available for review. (consider patient                            has family history of colon cancer in sister) Procedure Code(s):        --- Professional ---                           [phone], Colonoscopy, flexible; with removal of                            tumor(s), polyp(s), or other lesion(s) by snare                            technique Diagnosis Code(s):        --- Professional ---                           K62.4, Stenosis of anus and rectum                           D12.0, Benign neoplasm of cecum                           K62.5, Hemorrhage of anus and rectum CPT copyright 2018 American Medical Association. All rights reserved. The codes documented in this report are preliminary and upon coder review may  be revised to meet current compliance requirements. [name] L. Crystal Carrow, MD 08/10/2018 9:24:27 AM This report has been signed electronically. Number of Addenda: 0

## 2018-08-10 NOTE — Anesthesia Postprocedure Evaluation (Signed)
Anesthesia Post Note  Patient: Crystal Wall  Procedure(s) Performed: COLONOSCOPY WITH PROPOFOL (N/A ) POLYPECTOMY HEMOSTASIS CONTROL     Patient location during evaluation: Endoscopy Anesthesia Type: MAC Level of consciousness: awake and alert and oriented Pain management: pain level controlled Vital Signs Assessment: post-procedure vital signs reviewed and stable Respiratory status: spontaneous breathing, nonlabored ventilation and respiratory function stable Cardiovascular status: stable and blood pressure returned to baseline Postop Assessment: no apparent nausea or vomiting Anesthetic complications: no    Last Vitals:  Vitals:   08/10/18 0720 08/10/18 0921  BP: (!) 122/50 (!) 108/44  Pulse: (!) 44 (!) 57  Resp: 17 18  Temp: 36.8 C 36.4 C  SpO2: 99% 100%    Last Pain:  Vitals:   08/10/18 0921  TempSrc: Oral  PainSc: 0-No pain                 Shalayne Leach A.

## 2018-08-10 NOTE — Discharge Instructions (Signed)
YOU HAD AN ENDOSCOPIC PROCEDURE TODAY: Refer to the procedure report and other information in the discharge instructions given to you for any specific questions about what was found during the examination. If this information does not answer your questions, please call [place] office at [phone] to clarify.  ° °YOU SHOULD EXPECT: Some feelings of bloating in the abdomen. Passage of more gas than usual. Walking can help get rid of the air that was put into your GI tract during the procedure and reduce the bloating. If you had a lower endoscopy (such as a colonoscopy or flexible sigmoidoscopy) you may notice spotting of blood in your stool or on the toilet paper. Some abdominal soreness may be present for a day or two, also. ° °DIET: Your first meal following the procedure should be a light meal and then it is ok to progress to your normal diet. A half-sandwich or bowl of soup is an example of a good first meal. Heavy or fried foods are harder to digest and may make you feel nauseous or bloated. Drink plenty of fluids but you should avoid alcoholic beverages for 24 hours. If you had a esophageal dilation, please see attached instructions for diet.   ° °ACTIVITY: Your care partner should take you home directly after the procedure. You should plan to take it easy, moving slowly for the rest of the day. You can resume normal activity the day after the procedure however YOU SHOULD NOT DRIVE, use power tools, machinery or perform tasks that involve climbing or major physical exertion for 24 hours (because of the sedation medicines used during the test).  ° °SYMPTOMS TO REPORT IMMEDIATELY: °A gastroenterologist can be reached at any hour. Please call [phone]  for any of the following symptoms:  °Following lower endoscopy (colonoscopy, flexible sigmoidoscopy) °Excessive amounts of blood in the stool  °Significant tenderness, worsening of abdominal pains  °Swelling of the abdomen that is new, acute  °Fever of 100° or  higher  °Following upper endoscopy (EGD, EUS, ERCP, esophageal dilation) °Vomiting of blood or coffee ground material  °New, significant abdominal pain  °New, significant chest pain or pain under the shoulder blades  °Painful or persistently difficult swallowing  °New shortness of breath  °Black, tarry-looking or red, bloody stools ° °FOLLOW UP:  °If any biopsies were taken you will be contacted by phone or by letter within the next 1-3 weeks. Call [phone]  if you have not heard about the biopsies in 3 weeks.  °Please also call with any specific questions about appointments or follow up tests. ° °

## 2018-08-10 NOTE — Transfer of Care (Signed)
Immediate Anesthesia Transfer of Care Note  Patient: Crystal Wall  Procedure(s) Performed: COLONOSCOPY WITH PROPOFOL (N/A ) POLYPECTOMY HEMOSTASIS CONTROL  Patient Location: PACU and Endoscopy Unit  Anesthesia Type:MAC  Level of Consciousness: drowsy and patient cooperative  Airway & Oxygen Therapy: Patient Spontanous Breathing and Patient connected to face mask oxygen  Post-op Assessment: Report given to RN and Post -op Vital signs reviewed and stable  Post vital signs: Reviewed and stable  Last Vitals:  Vitals Value Taken Time  BP    Temp    Pulse    Resp    SpO2      Last Pain:  Vitals:   08/10/18 0720  TempSrc: Oral  PainSc: 0-No pain         Complications: No apparent anesthesia complications

## 2018-08-12 ENCOUNTER — Encounter (HOSPITAL_COMMUNITY): Payer: Self-pay | Admitting: Gastroenterology

## 2018-08-13 ENCOUNTER — Encounter: Payer: Self-pay | Admitting: Gynecology

## 2018-08-13 ENCOUNTER — Encounter: Payer: Self-pay | Admitting: Gastroenterology

## 2018-08-24 ENCOUNTER — Other Ambulatory Visit: Payer: 59

## 2018-08-31 ENCOUNTER — Other Ambulatory Visit: Payer: Self-pay

## 2018-08-31 ENCOUNTER — Emergency Department (HOSPITAL_BASED_OUTPATIENT_CLINIC_OR_DEPARTMENT_OTHER): Payer: 59

## 2018-08-31 ENCOUNTER — Emergency Department (HOSPITAL_BASED_OUTPATIENT_CLINIC_OR_DEPARTMENT_OTHER)
Admission: EM | Admit: 2018-08-31 | Discharge: 2018-08-31 | Disposition: A | Discharge status: Home / Self Care | Payer: 59 | Attending: Emergency Medicine | Admitting: Emergency Medicine

## 2018-08-31 ENCOUNTER — Encounter (HOSPITAL_BASED_OUTPATIENT_CLINIC_OR_DEPARTMENT_OTHER): Payer: Self-pay

## 2018-08-31 DIAGNOSIS — W010XXA Fall on same level from slipping, tripping and stumbling without subsequent striking against object, initial encounter: Secondary | ICD-10-CM | POA: Insufficient documentation

## 2018-08-31 DIAGNOSIS — E039 Hypothyroidism, unspecified: Secondary | ICD-10-CM | POA: Insufficient documentation

## 2018-08-31 DIAGNOSIS — Y9349 Activity, other involving dancing and other rhythmic movements: Secondary | ICD-10-CM | POA: Diagnosis not present

## 2018-08-31 DIAGNOSIS — S52514A Nondisplaced fracture of right radial styloid process, initial encounter for closed fracture: Secondary | ICD-10-CM | POA: Diagnosis not present

## 2018-08-31 DIAGNOSIS — S52501A Unspecified fracture of the lower end of right radius, initial encounter for closed fracture: Secondary | ICD-10-CM | POA: Insufficient documentation

## 2018-08-31 DIAGNOSIS — Z79899 Other long term (current) drug therapy: Secondary | ICD-10-CM | POA: Diagnosis not present

## 2018-08-31 DIAGNOSIS — Y999 Unspecified external cause status: Secondary | ICD-10-CM | POA: Insufficient documentation

## 2018-08-31 DIAGNOSIS — S6991XA Unspecified injury of right wrist, hand and finger(s), initial encounter: Secondary | ICD-10-CM | POA: Diagnosis present

## 2018-08-31 DIAGNOSIS — Y929 Unspecified place or not applicable: Secondary | ICD-10-CM | POA: Diagnosis not present

## 2018-08-31 NOTE — Discharge Instructions (Signed)
Please elevate and take Ibuprofen 600mg  for pain as needed Follow up with orthopedics Return if worsening

## 2018-08-31 NOTE — ED Notes (Signed)
Pt verbalizes understanding of d/c instructions and denies any further needs at this time. 

## 2018-08-31 NOTE — ED Provider Notes (Signed)
[facility] EMERGENCY DEPARTMENT Provider Note   CSN: [id number] Arrival date & time: 08/31/18  1851     History   Chief Complaint Chief Complaint  Patient presents with  . Wrist Injury    HPI Crystal Wall is a 52 y.o. female who presents with right wrist pain.  Past medical history significant for obesity, anemia.  Patient states that she was doing Zumba tonight and tripped and fell backwards with hands outstretched.  She had acute onset of pain in her right wrist associated swelling.  She also fell onto her buttocks and reports some soreness there but has been ambulatory without any difficulty and does not have severe pain here.  She denies head injury.  She is not on blood thinners.  She denies numbness, tingling, weakness of the fingers.  No elbow pain.  HPI  Past Medical History:  Diagnosis Date  . Allergic rhinitis   . B12 deficiency   . Hemorrhoids   . Hiatal hernia    as noted on barium swallow  . History of gestational diabetes   . Hyperplastic colon polyp   . Hypothyroidism   . Microcytic anemia   . Obesity   . Thyroid disease    hypothyroid    Patient Active Problem List   Diagnosis Date Noted  . Cecal polyp   . Iron deficiency anemia 06/04/2018  . Family history of malignant neoplasm of gastrointestinal tract 10/24/2013  . Benign neoplasm of colon 10/24/2013  . OBESITY 07/05/2008  . GERD 07/05/2008  . Rectal bleeding 07/05/2008  . FECAL INCONTINENCE 07/05/2008  . ESOPHAGEAL MOTILITY DISORDER 06/29/2008  . HIATAL HERNIA 06/29/2008    Past Surgical History:  Procedure Laterality Date  . CESAREAN SECTION  2001  . COLONOSCOPY N/A 10/24/2013   Procedure: COLONOSCOPY;  Surgeon: Inda Castle, MD;  Location: [facility] ENDOSCOPY;  Service: Endoscopy;  Laterality: N/A;  . COLONOSCOPY WITH PROPOFOL N/A 08/10/2018   Procedure: COLONOSCOPY WITH PROPOFOL;  Surgeon: Doran Stabler, MD;  Location: [facility] ENDOSCOPY;  Service: Gastroenterology;   Laterality: N/A;  . GASTRIC BYPASS  2007  . HYSTEROSCOPY  08/2010  . POLYPECTOMY  08/10/2018   Procedure: POLYPECTOMY;  Surgeon: Doran Stabler, MD;  Location: [facility];  Service: Gastroenterology;;     OB History    Gravida  1   Para  1   Term      Preterm      AB      Living  1     SAB      TAB      Ectopic      Multiple      Live Births               Home Medications    Prior to Admission medications   Medication Sig Start Date End Date Taking? Authorizing Provider  Calcium Carbonate (CALCIUM 500 PO) Take 500 mg by mouth 2 (two) times daily.     [provider]  cyanocobalamin (,VITAMIN B-12,) 1000 MCG/ML injection Inject 1,000 mcg into the muscle every 30 (thirty) days.     [provider]  Ferrous Sulfate (IRON) 325 (65 Fe) MG TABS Take 65 mg by mouth daily.    [provider]  ibuprofen (ADVIL,MOTRIN) 200 MG tablet Take 400 mg by mouth every 6 (six) hours as needed for headache or moderate pain.     [provider]  Multiple Vitamin (MULTIVITAMIN) tablet Take 1 tablet by mouth daily.  [provider]  omeprazole (PRILOSEC) 20 MG capsule Take 20 mg by mouth daily as needed (acid reflux).    [provider]  PEG-KCl-NaCl-NaSulf-Na Asc-C (PLENVU) 140 g SOLR Take 140 g by mouth as directed. 08/02/18   Doran Stabler, MD  SYNTHROID 137 MCG tablet Take 137 mcg by mouth daily before breakfast.  03/09/18   [provider]    Family History Family History  Problem Relation Age of Onset  . Heart disease Father        CHF  . Hypertension Father   . Prostate cancer Father   . Congestive Heart Failure Father   . Colon polyps Father   . Diabetes Sister   . Hypertension Sister   . Colon cancer Sister   . Uterine cancer Sister   . Hypertension Mother   . Colon polyps Mother   . Diabetes Maternal Grandmother     Social History Social History   Tobacco Use  . Smoking status: Never  Smoker  . Smokeless tobacco: Never Used  Substance Use Topics  . Alcohol use: Yes    Comment: occassionally  . Drug use: No     Allergies   Patient has no known allergies.   Review of Systems Review of Systems  Musculoskeletal: Positive for arthralgias and joint swelling.  Skin: Positive for color change. Negative for wound.  Neurological: Negative for weakness and numbness.     Physical Exam Updated Vital Signs BP 124/73 (BP Location: Left Arm)   Pulse 60   Temp 98.3 F (36.8 C) (Oral)   Resp 18   Ht 5\' 2"  (1.575 m)   Wt 127 kg   SpO2 95%   BMI 51.21 kg/m   Physical Exam  Constitutional: She is oriented to person, place, and time. She appears well-developed and well-nourished. No distress.  HENT:  Head: Normocephalic and atraumatic.  Eyes: Pupils are equal, round, and reactive to light. Conjunctivae are normal. Right eye exhibits no discharge. Left eye exhibits no discharge. No scleral icterus.  Neck: Normal range of motion.  Cardiovascular: Normal rate.  Pulmonary/Chest: Effort normal. No respiratory distress.  Abdominal: She exhibits no distension.  Musculoskeletal:  Right wrist: Large amount of swelling and bruising over the dorsal right wrist and distal forearm with associated tenderness. ROM of wrist deferred. FROM of fingers and elbow. N/V intact.  Neurological: She is alert and oriented to person, place, and time.  Skin: Skin is warm and dry.  Psychiatric: She has a normal mood and affect. Her behavior is normal.  Nursing note and vitals reviewed.    ED Treatments / Results  Labs (all labs ordered are listed, but only abnormal results are displayed) Labs Reviewed - No data to display  EKG None  Radiology Dg Wrist Complete Right  Result Date: 08/31/2018 CLINICAL DATA:  Fall EXAM: RIGHT WRIST - COMPLETE 3+ VIEW COMPARISON:  None. FINDINGS: Fracture of the radial styloid process extending into the wrist joint. Nondisplaced fracture distal radius on  the ulnar side. Minimal displacement. No fracture of the ulna or carpal bones. Significant dorsal soft tissue swelling. Moderate degenerative change base of thumb IMPRESSION: Nondisplaced fracture radial styloid, and distal radius Electronically Signed   By: Franchot Gallo M.D.   On: 08/31/2018 19:56    Procedures Procedures (including critical care time)  Medications Ordered in ED Medications - No data to display   Initial Impression / Assessment and Plan / ED Course  I have reviewed the triage vital signs  and the nursing notes.  Pertinent labs & imaging results that were available during my care of the patient were reviewed by me and considered in my medical decision making (see chart for details).  52 year old female presents with acute swelling and pain of the right wrist and distal forearm after a fall on outstretched hand.  She has a significant amount of swelling on exam.  She has full range of motion of her fingers.  She is neurovascularly intact.  No elbow tenderness.  X-ray is remarkable for nondisplaced fracture of the radial styloid and distal radius.  Will place an volar splint and have her elevate and take NSAIDs.  She was to follow-up with Dr. Marlou Sa.  A referral was given.  Final Clinical Impressions(s) / ED Diagnoses   Final diagnoses:  Closed nondisplaced fracture of styloid process of right radius, initial encounter  Closed fracture of distal end of right radius, unspecified fracture morphology, initial encounter    ED Discharge Orders    None       Recardo Evangelist, PA-C 08/31/18 2012    Margette Fast, MD 09/01/18 [phone]

## 2018-08-31 NOTE — ED Triage Notes (Signed)
Pt c/o fall/right wrist injury ~610p-swelling noted-NAD-steady gait

## 2018-09-09 ENCOUNTER — Ambulatory Visit (INDEPENDENT_AMBULATORY_CARE_PROVIDER_SITE_OTHER): Payer: 59 | Admitting: Orthopedic Surgery

## 2018-09-09 ENCOUNTER — Ambulatory Visit (INDEPENDENT_AMBULATORY_CARE_PROVIDER_SITE_OTHER): Payer: Self-pay

## 2018-09-09 DIAGNOSIS — M25531 Pain in right wrist: Secondary | ICD-10-CM

## 2018-09-09 DIAGNOSIS — S52511A Displaced fracture of right radial styloid process, initial encounter for closed fracture: Secondary | ICD-10-CM

## 2018-09-09 DIAGNOSIS — S62101A Fracture of unspecified carpal bone, right wrist, initial encounter for closed fracture: Secondary | ICD-10-CM

## 2018-09-11 ENCOUNTER — Encounter (INDEPENDENT_AMBULATORY_CARE_PROVIDER_SITE_OTHER): Payer: Self-pay | Admitting: Orthopedic Surgery

## 2018-09-11 NOTE — Progress Notes (Signed)
Office Visit Note   Patient: Crystal Wall           Date of Birth: 1966/07/16           MRN: [id number] Visit Date: 09/09/2018 Requested by: Jilda Panda, MD [id number] [place], [place] [address] PCP: Jilda Panda, MD  Subjective: No chief complaint on file.   HPI: Patient presents with injury to the right wrist.  She sustained an injury 08/31/2018 when she fell backwards doing yoga class.  She is right-hand dominant.  She works as a Engineer, maintenance (IT).  She has had the wrist in a splint since that time.  Denies any elbow pain.              ROS: All systems reviewed are negative as they relate to the chief complaint within the history of present illness.  Patient denies  fevers or chills.   Assessment & Plan: Visit Diagnoses:  1. Pain in right wrist   2. Closed fracture of right wrist, initial encounter     Plan: Impression is minimally displaced radial styloid fracture.  Plan is short arm cast for 3 weeks.  Come back in 3 weeks for initiation of range of motion and repeat radiographs.  We will see her back at that time  Follow-Up Instructions: Return in about 3 weeks (around 09/30/2018).   Orders:  Orders Placed This Encounter  Procedures  . XR Wrist Complete Right   No orders of the defined types were placed in this encounter.     Procedures: No procedures performed   Clinical Data: No additional findings.  Objective: Vital Signs: There were no vitals taken for this visit.  Physical Exam:   Constitutional: Patient appears well-developed HEENT:  Head: Normocephalic Eyes:EOM are normal Neck: Normal range of motion Cardiovascular: Normal rate Pulmonary/chest: Effort normal Neurologic: Patient is alert Skin: Skin is warm Psychiatric: Patient has normal mood and affect    Ortho Exam: Ortho exam demonstrates good grip strength.  Ecchymosis and bruising noted on the volar aspect of the distal forearm.  EPL FPL interosseous function intact.  No snuffbox tenderness  present.  Patient does have tenderness over the radial styloid.  Specialty Comments:  No specialty comments available.  Imaging: No results found.   PMFS History: Patient Active Problem List   Diagnosis Date Noted  . Cecal polyp   . Iron deficiency anemia 06/04/2018  . Family history of malignant neoplasm of gastrointestinal tract 10/24/2013  . Benign neoplasm of colon 10/24/2013  . OBESITY 07/05/2008  . GERD 07/05/2008  . Rectal bleeding 07/05/2008  . FECAL INCONTINENCE 07/05/2008  . ESOPHAGEAL MOTILITY DISORDER 06/29/2008  . HIATAL HERNIA 06/29/2008   Past Medical History:  Diagnosis Date  . Allergic rhinitis   . B12 deficiency   . Hemorrhoids   . Hiatal hernia    as noted on barium swallow  . History of gestational diabetes   . Hyperplastic colon polyp   . Hypothyroidism   . Microcytic anemia   . Obesity   . Thyroid disease    hypothyroid    Family History  Problem Relation Age of Onset  . Heart disease Father        CHF  . Hypertension Father   . Prostate cancer Father   . Congestive Heart Failure Father   . Colon polyps Father   . Diabetes Sister   . Hypertension Sister   . Colon cancer Sister   . Uterine cancer Sister   . Hypertension Mother   .  Colon polyps Mother   . Diabetes Maternal Grandmother     Past Surgical History:  Procedure Laterality Date  . CESAREAN SECTION  2001  . COLONOSCOPY N/A 10/24/2013   Procedure: COLONOSCOPY;  Surgeon: Inda Castle, MD;  Location: [facility];  Service: Endoscopy;  Laterality: N/A;  . COLONOSCOPY WITH PROPOFOL N/A 08/10/2018   Procedure: COLONOSCOPY WITH PROPOFOL;  Surgeon: Doran Stabler, MD;  Location: [facility];  Service: Gastroenterology;  Laterality: N/A;  . GASTRIC BYPASS  2007  . HYSTEROSCOPY  08/2010  . POLYPECTOMY  08/10/2018   Procedure: POLYPECTOMY;  Surgeon: Doran Stabler, MD;  Location: [facility];  Service: Gastroenterology;;   Social History   Occupational History  . Not on  file  Tobacco Use  . Smoking status: Never Smoker  . Smokeless tobacco: Never Used  Substance and Sexual Activity  . Alcohol use: Yes    Comment: occassionally  . Drug use: No  . Sexual activity: Yes    Birth control/protection: Other-see comments    Comment: vasectomy-1st intercourse 52 yo-More than 5 partners

## 2018-09-11 NOTE — Addendum Note (Signed)
Addended by: Marcene Duos on: 09/11/2018 09:12 AM   Modules accepted: Level of Service

## 2018-10-07 ENCOUNTER — Ambulatory Visit (INDEPENDENT_AMBULATORY_CARE_PROVIDER_SITE_OTHER): Payer: 59

## 2018-10-07 ENCOUNTER — Ambulatory Visit (INDEPENDENT_AMBULATORY_CARE_PROVIDER_SITE_OTHER): Payer: 59 | Admitting: Orthopedic Surgery

## 2018-10-07 ENCOUNTER — Encounter (INDEPENDENT_AMBULATORY_CARE_PROVIDER_SITE_OTHER): Payer: Self-pay | Admitting: Orthopedic Surgery

## 2018-10-07 ENCOUNTER — Ambulatory Visit (INDEPENDENT_AMBULATORY_CARE_PROVIDER_SITE_OTHER): Payer: Self-pay

## 2018-10-07 DIAGNOSIS — M25551 Pain in right hip: Secondary | ICD-10-CM

## 2018-10-07 DIAGNOSIS — M25531 Pain in right wrist: Secondary | ICD-10-CM | POA: Diagnosis not present

## 2018-10-07 NOTE — Progress Notes (Signed)
Post-Op Visit Note   Patient: Crystal Wall           Date of Birth: 1965-10-09           MRN: [id number] Visit Date: 10/07/2018 PCP: Jilda Panda, MD   Assessment & Plan:  Chief Complaint:  Chief Complaint  Patient presents with  . Right Wrist - Follow-up   Visit Diagnoses:  1. Pain in right wrist     Plan: Crystal Wall is a patient with right wrist pain.  Date of injury 08/31/2018.  She had radial styloid fracture at that time.  She is been in a cast.  On exam the cast is removed.  She has pretty reasonable grip strength and range of motion.  Also looked at the wrist under fluoroscopy and the joint space is maintained.  She is also reporting a little bit of right hip pain.  Radiographs and exam on that are normal except for some very mild arthritis on the right-hand side.  I am going to have her go into a wrist splint but start doing some wrist range of motion exercises pronation supination flexion-extension for the next month.  I will see her back in 4 weeks for clinical recheck on that wrist.  Follow-Up Instructions: No follow-ups on file.   Orders:  Orders Placed This Encounter  Procedures  . XR Wrist Complete Right   No orders of the defined types were placed in this encounter.   Imaging: No results found.  PMFS History: Patient Active Problem List   Diagnosis Date Noted  . Cecal polyp   . Iron deficiency anemia 06/04/2018  . Family history of malignant neoplasm of gastrointestinal tract 10/24/2013  . Benign neoplasm of colon 10/24/2013  . OBESITY 07/05/2008  . GERD 07/05/2008  . Rectal bleeding 07/05/2008  . FECAL INCONTINENCE 07/05/2008  . ESOPHAGEAL MOTILITY DISORDER 06/29/2008  . HIATAL HERNIA 06/29/2008   Past Medical History:  Diagnosis Date  . Allergic rhinitis   . B12 deficiency   . Hemorrhoids   . Hiatal hernia    as noted on barium swallow  . History of gestational diabetes   . Hyperplastic colon polyp   . Hypothyroidism   . Microcytic anemia    . Obesity   . Thyroid disease    hypothyroid    Family History  Problem Relation Age of Onset  . Heart disease Father        CHF  . Hypertension Father   . Prostate cancer Father   . Congestive Heart Failure Father   . Colon polyps Father   . Diabetes Sister   . Hypertension Sister   . Colon cancer Sister   . Uterine cancer Sister   . Hypertension Mother   . Colon polyps Mother   . Diabetes Maternal Grandmother     Past Surgical History:  Procedure Laterality Date  . CESAREAN SECTION  2001  . COLONOSCOPY N/A 10/24/2013   Procedure: COLONOSCOPY;  Surgeon: Inda Castle, MD;  Location: [facility];  Service: Endoscopy;  Laterality: N/A;  . COLONOSCOPY WITH PROPOFOL N/A 08/10/2018   Procedure: COLONOSCOPY WITH PROPOFOL;  Surgeon: Doran Stabler, MD;  Location: [facility];  Service: Gastroenterology;  Laterality: N/A;  . GASTRIC BYPASS  2007  . HYSTEROSCOPY  08/2010  . POLYPECTOMY  08/10/2018   Procedure: POLYPECTOMY;  Surgeon: Doran Stabler, MD;  Location: [facility];  Service: Gastroenterology;;   Social History   Occupational History  . Not on file  Tobacco Use  . Smoking status: Never Smoker  . Smokeless tobacco: Never Used  Substance and Sexual Activity  . Alcohol use: Yes    Comment: occassionally  . Drug use: No  . Sexual activity: Yes    Birth control/protection: Other-see comments    Comment: vasectomy-1st intercourse 53 yo-More than 5 partners

## 2018-11-03 ENCOUNTER — Encounter (INDEPENDENT_AMBULATORY_CARE_PROVIDER_SITE_OTHER): Payer: Self-pay | Admitting: Orthopedic Surgery

## 2018-11-03 ENCOUNTER — Ambulatory Visit (INDEPENDENT_AMBULATORY_CARE_PROVIDER_SITE_OTHER): Payer: 59 | Admitting: Orthopedic Surgery

## 2018-11-03 DIAGNOSIS — S62101A Fracture of unspecified carpal bone, right wrist, initial encounter for closed fracture: Secondary | ICD-10-CM

## 2018-11-03 NOTE — Progress Notes (Signed)
Post-Op Visit Note   Patient: Crystal Wall           Date of Birth: 1965-10-28           MRN: [id number] Visit Date: 11/03/2018 PCP: Jilda Panda, MD   Assessment & Plan:  Chief Complaint:  Chief Complaint  Patient presents with  . Right Wrist - Pain, Follow-up   Visit Diagnoses:  1. Closed fracture of right wrist, initial encounter     Plan: Patient presents 8 weeks out right wrist radial styloid fracture.  She has been doing range of motion exercises and therapy.  She has been wearing wrist splint.  She is currently working as an Optometrist.  On exam she has about 40 of dorsiflexion 40 of palmar flexion.  Therapy note is reviewed.  I think at this time DC the wrist splint and start working on more strengthening and range of motion exercises.  I will see her back as needed.  I think she should make closer full recovery from this injury.  Follow-Up Instructions: Return if symptoms worsen or fail to improve.   Orders:  No orders of the defined types were placed in this encounter.  No orders of the defined types were placed in this encounter.   Imaging: No results found.  PMFS History: Patient Active Problem List   Diagnosis Date Noted  . Cecal polyp   . Iron deficiency anemia 06/04/2018  . Family history of malignant neoplasm of gastrointestinal tract 10/24/2013  . Benign neoplasm of colon 10/24/2013  . OBESITY 07/05/2008  . GERD 07/05/2008  . Rectal bleeding 07/05/2008  . FECAL INCONTINENCE 07/05/2008  . ESOPHAGEAL MOTILITY DISORDER 06/29/2008  . HIATAL HERNIA 06/29/2008   Past Medical History:  Diagnosis Date  . Allergic rhinitis   . B12 deficiency   . Hemorrhoids   . Hiatal hernia    as noted on barium swallow  . History of gestational diabetes   . Hyperplastic colon polyp   . Hypothyroidism   . Microcytic anemia   . Obesity   . Thyroid disease    hypothyroid    Family History  Problem Relation Age of Onset  . Heart disease Father        CHF    . Hypertension Father   . Prostate cancer Father   . Congestive Heart Failure Father   . Colon polyps Father   . Diabetes Sister   . Hypertension Sister   . Colon cancer Sister   . Uterine cancer Sister   . Hypertension Mother   . Colon polyps Mother   . Diabetes Maternal Grandmother     Past Surgical History:  Procedure Laterality Date  . CESAREAN SECTION  2001  . COLONOSCOPY N/A 10/24/2013   Procedure: COLONOSCOPY;  Surgeon: Inda Castle, MD;  Location: [facility];  Service: Endoscopy;  Laterality: N/A;  . COLONOSCOPY WITH PROPOFOL N/A 08/10/2018   Procedure: COLONOSCOPY WITH PROPOFOL;  Surgeon: Doran Stabler, MD;  Location: [facility];  Service: Gastroenterology;  Laterality: N/A;  . GASTRIC BYPASS  2007  . HYSTEROSCOPY  08/2010  . POLYPECTOMY  08/10/2018   Procedure: POLYPECTOMY;  Surgeon: Doran Stabler, MD;  Location: [facility];  Service: Gastroenterology;;   Social History   Occupational History  . Not on file  Tobacco Use  . Smoking status: Never Smoker  . Smokeless tobacco: Never Used  Substance and Sexual Activity  . Alcohol use: Yes    Comment: occassionally  . Drug  use: No  . Sexual activity: Yes    Birth control/protection: Other-see comments    Comment: vasectomy-1st intercourse 53 yo-More than 5 partners

## 2018-11-05 ENCOUNTER — Telehealth: Payer: Self-pay

## 2018-11-05 NOTE — Telephone Encounter (Signed)
Appointment r/s per 1/22 phone msg . Spoke with patient and she is aware of these changes MyChart

## 2018-11-24 ENCOUNTER — Other Ambulatory Visit: Payer: 59

## 2018-11-24 ENCOUNTER — Ambulatory Visit: Payer: 59 | Admitting: Hematology

## 2018-11-24 NOTE — Progress Notes (Signed)
[name]   Telephone:(336) [phone] Fax:(336) [phone]   Clinic Follow up Note   Patient Care Team: Jilda Panda, MD as PCP - General (Internal Medicine)  Date of Service:  11/26/2018  CHIEF COMPLAINT: Iron deficiency anemia    CURRENT THERAPY:  -Oral ferrous sulfate 65mg  daily  -IV Ferahame as needed -Monthly B12 injections at home   INTERVAL HISTORY:  Crystal Wall is here for a follow up of IDA. She was last seen by me 6 months ago. She presents to the clinic today by herself. She notes she is doing well. She has been taking oral iron most of the time. She denies constipation with pill.    REVIEW OF SYSTEMS:   Constitutional: Denies fevers, chills or abnormal weight loss Eyes: Denies blurriness of vision Ears, nose, mouth, throat, and face: Denies mucositis or sore throat Respiratory: Denies cough, dyspnea or wheezes Cardiovascular: Denies palpitation, chest discomfort or lower extremity swelling Gastrointestinal:  Denies nausea, heartburn or change in bowel habits Skin: Denies abnormal skin rashes Lymphatics: Denies new lymphadenopathy or easy bruising Neurological:Denies numbness, tingling or new weaknesses Behavioral/Psych: Mood is stable, no new changes  All other systems were reviewed with the patient and are negative.  MEDICAL HISTORY:  Past Medical History:  Diagnosis Date  . Allergic rhinitis   . B12 deficiency   . Hemorrhoids   . Hiatal hernia    as noted on barium swallow  . History of gestational diabetes   . Hyperplastic colon polyp   . Hypothyroidism   . Microcytic anemia   . Obesity   . Thyroid disease    hypothyroid    SURGICAL HISTORY: Past Surgical History:  Procedure Laterality Date  . CESAREAN SECTION  2001  . COLONOSCOPY N/A 10/24/2013   Procedure: COLONOSCOPY;  Surgeon: Inda Castle, MD;  Location: [facility];  Service: Endoscopy;  Laterality: N/A;  . COLONOSCOPY WITH PROPOFOL N/A 08/10/2018   Procedure:  COLONOSCOPY WITH PROPOFOL;  Surgeon: Doran Stabler, MD;  Location: [facility];  Service: Gastroenterology;  Laterality: N/A;  . GASTRIC BYPASS  2007  . HYSTEROSCOPY  08/2010  . POLYPECTOMY  08/10/2018   Procedure: POLYPECTOMY;  Surgeon: Doran Stabler, MD;  Location: [facility];  Service: Gastroenterology;;    I have reviewed the social history and family history with the patient and they are unchanged from previous note.  ALLERGIES:  has No Known Allergies.  MEDICATIONS:  Current Outpatient Medications  Medication Sig Dispense Refill  . Calcium Carbonate (CALCIUM 500 PO) Take 500 mg by mouth 2 (two) times daily.     . cyanocobalamin (,VITAMIN B-12,) 1000 MCG/ML injection Inject 1,000 mcg into the muscle every 30 (thirty) days.     . Ferrous Sulfate (IRON) 325 (65 Fe) MG TABS Take 65 mg by mouth daily.    Marland Kitchen ibuprofen (ADVIL,MOTRIN) 200 MG tablet Take 400 mg by mouth every 6 (six) hours as needed for headache or moderate pain.     . Multiple Vitamin (MULTIVITAMIN) tablet Take 1 tablet by mouth daily.      Marland Kitchen omeprazole (PRILOSEC) 20 MG capsule Take 20 mg by mouth daily as needed (acid reflux).    . SYNTHROID 137 MCG tablet Take 137 mcg by mouth daily before breakfast.   6   Current Facility-Administered Medications  Medication Dose Route Frequency Provider Last Rate Last Dose  . levonorgestrel (MIRENA) 20 MCG/24HR IUD   Intrauterine Once Fontaine, Belinda Block, MD  PHYSICAL EXAMINATION: ECOG PERFORMANCE STATUS: 0 - Asymptomatic  Vitals:   11/26/18 1437  BP: (!) 148/77  Pulse: (!) 55  Resp: 18  Temp: 97.6 F (36.4 C)  SpO2: 100%   Filed Weights   11/26/18 1437  Weight: 281 lb 3.2 oz (127.6 kg)    GENERAL:alert, no distress and comfortable SKIN: skin color, texture, turgor are normal, no rashes or significant lesions EYES: normal, Conjunctiva are pink and non-injected, sclera clear OROPHARYNX:no exudate, no erythema and lips, buccal mucosa, and tongue normal    NECK: supple, thyroid normal size, non-tender, without nodularity LYMPH:  no palpable lymphadenopathy in the cervical, axillary or inguinal LUNGS: clear to auscultation and percussion with normal breathing effort HEART: regular rate & rhythm and no murmurs and no lower extremity edema ABDOMEN:abdomen soft, non-tender and normal bowel sounds Musculoskeletal:no cyanosis of digits and no clubbing  NEURO: alert & oriented x 3 with fluent speech, no focal motor/sensory deficits  LABORATORY DATA:  I have reviewed the data as listed CBC Latest Ref Rng & Units 11/26/2018 07/21/2018 06/02/2018  WBC 4.0 - 10.5 K/uL 6.0 5.5 6.2  Hemoglobin 12.0 - 15.0 g/dL 13.5 13.8 11.6  Hematocrit 36.0 - 46.0 % 41.0 43.1 36.3  Platelets 150 - 400 K/uL 207 208 229     No flowsheet data found.  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 - One 15 mm polyp in the cecum, removed piecemeal using a cold snare. Resected and retrieved. Clip (MR conditional) was placed. - The examination was otherwise normal on direct and retroflexion views. ----BENIGN   RADIOGRAPHIC STUDIES: I have personally reviewed the radiological images as listed and agreed with the findings in the report. No results found.   ASSESSMENT & PLAN:  Crystal Wall is a 53 y.o. female with   1. Iron deficiency, B12 deficiency with mild anemia, secondary to previous gastric bypass surgery  -She initally had intermittent mild anemia and chronic iron deficiency. I do not have recent outside records to compare.  -She was treated with 2 doses of IV Feraheme in 06/2018 and had responded very well.  -Labs reviewed, Anemia resolved. Iron panel is pending.  -She continues monthly B12 injection, her spouse performs this at home. -She will continue oral iron daily.  -will monitor her labs every 3 months for IV iron inclination.  -F/u in 11 months    PLAN:  -Continue oral iron daily and monthly B12  injections at home  -Lab in 5,  11 months, she will have lab with her primary care physician in 2 and 8 months -F/u in 11 months with lab  -will set up iv iron if needed     No problem-specific Assessment & Plan notes found for this encounter.   No orders of the defined types were placed in this encounter.  All questions were answered. The patient knows to call the clinic with any problems, questions or concerns. No barriers to learning was detected. I spent 10 minutes counseling the patient face to face. The total time spent in the appointment was 15 minutes and more than 50% was on counseling and review of test results     Truitt Merle, MD 11/26/2018   I, Joslyn Devon, am acting as scribe for Truitt Merle, MD.   I have reviewed the above documentation for accuracy and completeness, and I agree with the above.

## 2018-11-26 ENCOUNTER — Inpatient Hospital Stay: Payer: 59 | Attending: Hematology

## 2018-11-26 ENCOUNTER — Telehealth: Payer: Self-pay | Admitting: Hematology

## 2018-11-26 ENCOUNTER — Encounter: Payer: Self-pay | Admitting: Hematology

## 2018-11-26 ENCOUNTER — Inpatient Hospital Stay (HOSPITAL_BASED_OUTPATIENT_CLINIC_OR_DEPARTMENT_OTHER): Payer: 59 | Admitting: Hematology

## 2018-11-26 VITALS — BP 148/77 | HR 55 | Temp 97.6°F | Resp 18 | Ht 62.0 in | Wt 281.2 lb

## 2018-11-26 DIAGNOSIS — E538 Deficiency of other specified B group vitamins: Secondary | ICD-10-CM | POA: Diagnosis not present

## 2018-11-26 DIAGNOSIS — Z9884 Bariatric surgery status: Secondary | ICD-10-CM

## 2018-11-26 DIAGNOSIS — D509 Iron deficiency anemia, unspecified: Secondary | ICD-10-CM

## 2018-11-26 DIAGNOSIS — D508 Other iron deficiency anemias: Secondary | ICD-10-CM

## 2018-11-26 LAB — CBC WITH DIFFERENTIAL (CANCER CENTER ONLY)
Abs Immature Granulocytes: 0.02 10*3/uL (ref 0.00–0.07)
BASOS ABS: 0 10*3/uL (ref 0.0–0.1)
BASOS PCT: 1 %
EOS PCT: 3 %
Eosinophils Absolute: 0.2 10*3/uL (ref 0.0–0.5)
HCT: 41 % (ref 36.0–46.0)
HEMOGLOBIN: 13.5 g/dL (ref 12.0–15.0)
Immature Granulocytes: 0 %
LYMPHS PCT: 24 %
Lymphs Abs: 1.4 10*3/uL (ref 0.7–4.0)
MCH: 31.2 pg (ref 26.0–34.0)
MCHC: 32.9 g/dL (ref 30.0–36.0)
MCV: 94.7 fL (ref 80.0–100.0)
Monocytes Absolute: 0.3 10*3/uL (ref 0.1–1.0)
Monocytes Relative: 5 %
NEUTROS ABS: 4.1 10*3/uL (ref 1.7–7.7)
Neutrophils Relative %: 67 %
PLATELETS: 207 10*3/uL (ref 150–400)
RBC: 4.33 MIL/uL (ref 3.87–5.11)
RDW: 13.8 % (ref 11.5–15.5)
WBC: 6 10*3/uL (ref 4.0–10.5)
nRBC: 0 % (ref 0.0–0.2)

## 2018-11-26 LAB — IRON AND TIBC
Iron: 69 ug/dL (ref 41–142)
Saturation Ratios: 21 % (ref 21–57)
TIBC: 331 ug/dL (ref 236–444)
UIBC: 262 ug/dL (ref 120–384)

## 2018-11-26 LAB — FERRITIN: Ferritin: 30 ng/mL (ref 11–307)

## 2018-11-26 LAB — VITAMIN B12: Vitamin B-12: 331 pg/mL (ref 180–914)

## 2018-11-26 NOTE — Telephone Encounter (Signed)
Gave avs and calendar ° °

## 2019-04-26 ENCOUNTER — Other Ambulatory Visit: Payer: Self-pay

## 2019-04-26 ENCOUNTER — Telehealth: Payer: Self-pay

## 2019-04-26 ENCOUNTER — Inpatient Hospital Stay: Payer: 59 | Attending: Hematology

## 2019-04-26 DIAGNOSIS — D509 Iron deficiency anemia, unspecified: Secondary | ICD-10-CM | POA: Diagnosis present

## 2019-04-26 LAB — CBC WITH DIFFERENTIAL (CANCER CENTER ONLY)
Abs Immature Granulocytes: 0.01 10*3/uL (ref 0.00–0.07)
Basophils Absolute: 0.1 10*3/uL (ref 0.0–0.1)
Basophils Relative: 1 %
Eosinophils Absolute: 0.1 10*3/uL (ref 0.0–0.5)
Eosinophils Relative: 3 %
HCT: 40.7 % (ref 36.0–46.0)
Hemoglobin: 13.8 g/dL (ref 12.0–15.0)
Immature Granulocytes: 0 %
Lymphocytes Relative: 29 %
Lymphs Abs: 1.4 10*3/uL (ref 0.7–4.0)
MCH: 30.7 pg (ref 26.0–34.0)
MCHC: 33.9 g/dL (ref 30.0–36.0)
MCV: 90.4 fL (ref 80.0–100.0)
Monocytes Absolute: 0.2 10*3/uL (ref 0.1–1.0)
Monocytes Relative: 5 %
Neutro Abs: 3 10*3/uL (ref 1.7–7.7)
Neutrophils Relative %: 62 %
Platelet Count: 194 10*3/uL (ref 150–400)
RBC: 4.5 MIL/uL (ref 3.87–5.11)
RDW: 13.2 % (ref 11.5–15.5)
WBC Count: 4.8 10*3/uL (ref 4.0–10.5)
nRBC: 0 % (ref 0.0–0.2)

## 2019-04-26 LAB — IRON AND TIBC
Iron: 80 ug/dL (ref 41–142)
Saturation Ratios: 24 % (ref 21–57)
TIBC: 338 ug/dL (ref 236–444)
UIBC: 258 ug/dL (ref 120–384)

## 2019-04-26 LAB — VITAMIN B12: Vitamin B-12: 444 pg/mL (ref 180–914)

## 2019-04-26 LAB — FERRITIN: Ferritin: 43 ng/mL (ref 11–307)

## 2019-04-26 NOTE — Telephone Encounter (Signed)
Left voicemail for patient to cal back [facility]

## 2019-04-26 NOTE — Telephone Encounter (Signed)
TC per Lacie to patient to let her know CBC and iron studies are normal. No anemia. Continue oral iron. Please have labs checked at PCP in 3 months and return to [place] in 6 months as scheduled. Patient verbalized understanding. No further problems or concerns at this time.

## 2019-06-29 ENCOUNTER — Encounter: Payer: Self-pay | Admitting: Gynecology

## 2019-07-27 ENCOUNTER — Telehealth: Payer: Self-pay | Admitting: Gastroenterology

## 2019-07-27 NOTE — Telephone Encounter (Signed)
Spoke to the patient, told the patient that she would be added to the wait list of patients that need hospital procedures. 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   The patient has been now added to the wait list.

## 2019-08-03 ENCOUNTER — Encounter: Payer: Self-pay | Admitting: *Deleted

## 2019-08-03 ENCOUNTER — Other Ambulatory Visit: Payer: Self-pay | Admitting: *Deleted

## 2019-08-03 DIAGNOSIS — Z86010 Personal history of colonic polyps: Secondary | ICD-10-CM

## 2019-08-03 NOTE — Telephone Encounter (Signed)
Spoke with the patient, scheduled the patient for the following:    09/13/2019 at 8:00 am previsit with the nurse  09/23/2019 at 8:30 am COVID screening  09/27/2019 at 8:30 am colonoscopy at [facility] (Case ID# [id number])  Reminder letter mailed to the patient, MyChart message sent to the patient. Patient verbalized understanding. No other questions or concerns voiced by the conclusion of the call.

## 2019-08-25 ENCOUNTER — Other Ambulatory Visit: Payer: Self-pay

## 2019-08-26 ENCOUNTER — Encounter: Payer: Self-pay | Admitting: Gynecology

## 2019-08-26 ENCOUNTER — Ambulatory Visit (INDEPENDENT_AMBULATORY_CARE_PROVIDER_SITE_OTHER): Payer: 59 | Admitting: Gynecology

## 2019-08-26 VITALS — BP 144/86 | Ht 62.0 in | Wt 281.0 lb

## 2019-08-26 DIAGNOSIS — Z01419 Encounter for gynecological examination (general) (routine) without abnormal findings: Secondary | ICD-10-CM

## 2019-08-26 DIAGNOSIS — Z1151 Encounter for screening for human papillomavirus (HPV): Secondary | ICD-10-CM | POA: Diagnosis not present

## 2019-08-26 NOTE — Progress Notes (Signed)
    Crystal Wall [birth year] [id number]        53 y.o.  G1P1 for annual gynecologic exam.  Without gynecologic complaints.  Continues to lose weight having lost 60 pounds since last year.  Past medical history,surgical history, problem list, medications, allergies, family history and social history were all reviewed and documented as reviewed in the EPIC chart.  ROS:  Performed with pertinent positives and negatives included in the history, assessment and plan.   Additional significant findings : None   Exam: Crystal Wall assistant Vitals:   08/26/19 1515  BP: (!) 144/86  Weight: 281 lb (127.5 kg)  Height: 5\' 2"  (1.575 m)   Body mass index is 51.4 kg/m.  General appearance:  Normal affect, orientation and appearance. Skin: Grossly normal HEENT: Without gross lesions.  No cervical or supraclavicular adenopathy. Thyroid normal.  Lungs:  Clear without wheezing, rales or rhonchi Cardiac: RR, without RMG Abdominal:  Soft, nontender, without masses, guarding, rebound, organomegaly or hernia Breasts:  Examined lying and sitting without masses, retractions, discharge or axillary adenopathy. Pelvic:  Ext, BUS, Vagina: Normal  Cervix: Normal.  Pap smear/HPV  Uterus: Unable to clearly palpate but no gross masses or tenderness.  Adnexa: Without masses or tenderness    Anus and perineum: Normal   Rectovaginal: Normal sphincter tone without palpated masses or tenderness.    Assessment/Plan:  53 y.o. G1P1 female for annual gynecologic exam.   1. Postmenopausal.  No significant menopausal symptoms or any vaginal bleeding. 2. Pap smear/HPV 2015.  Pap smear/HPV today.  No history of significant abnormal Pap smears. 3. Mammography today.  Breast exam normal today. 4. Colonoscopy 2019.  Repeat at their recommended interval. 5. Health maintenance.  Blood pressure 144/86 noted to the patient.  Recommended recheck in nonexam situation.  Follow-up with primary provider if continues elevated.   No routine lab work done as patient does this elsewhere.  Follow-up 1 year, sooner as needed.   [name] MD, 3:40 PM 08/26/2019

## 2019-08-26 NOTE — Addendum Note (Signed)
Addended by: Nelva Nay on: 08/26/2019 03:49 PM   Modules accepted: Orders

## 2019-08-26 NOTE — Patient Instructions (Signed)
Follow-up in 1 year for annual exam, sooner if any issues.  Recheck your blood pressure in a nonexam situation.  Your blood pressure today was 144/86.

## 2019-08-30 LAB — PAP IG AND HPV HIGH-RISK: HPV DNA High Risk: NOT DETECTED

## 2019-09-13 ENCOUNTER — Other Ambulatory Visit: Payer: Self-pay

## 2019-09-13 ENCOUNTER — Ambulatory Visit (AMBULATORY_SURGERY_CENTER): Payer: Self-pay

## 2019-09-13 VITALS — Temp 97.1°F | Ht 62.0 in | Wt 283.3 lb

## 2019-09-13 DIAGNOSIS — Z86010 Personal history of colonic polyps: Secondary | ICD-10-CM

## 2019-09-13 DIAGNOSIS — Z800 Family history of malignant neoplasm of digestive organs: Secondary | ICD-10-CM

## 2019-09-13 MED ORDER — PLENVU 140 G PO SOLR: 1.0000 | Freq: Once | ORAL | 0 refills | Status: AC

## 2019-09-13 NOTE — Progress Notes (Signed)
Denies allergies to eggs or soy products. Denies complication of anesthesia or sedation. Denies use of weight loss medication. Denies use of O2.   Emmi instructions given for colonoscopy.  Covid screening is scheduled for 09/23/19 @ 8:30 Am. A coupon for Plenvu was given to the patient.

## 2019-09-23 ENCOUNTER — Other Ambulatory Visit (HOSPITAL_COMMUNITY)
Admission: RE | Admit: 2019-09-23 | Discharge: 2019-09-23 | Disposition: A | Discharge status: Home / Self Care | Payer: 59 | Source: Ambulatory Visit | Attending: Gastroenterology | Admitting: Gastroenterology

## 2019-09-23 DIAGNOSIS — E538 Deficiency of other specified B group vitamins: Secondary | ICD-10-CM | POA: Diagnosis not present

## 2019-09-23 DIAGNOSIS — Z20828 Contact with and (suspected) exposure to other viral communicable diseases: Secondary | ICD-10-CM | POA: Diagnosis not present

## 2019-09-23 DIAGNOSIS — K635 Polyp of colon: Secondary | ICD-10-CM | POA: Diagnosis not present

## 2019-09-23 DIAGNOSIS — Z1211 Encounter for screening for malignant neoplasm of colon: Secondary | ICD-10-CM | POA: Diagnosis present

## 2019-09-23 DIAGNOSIS — E039 Hypothyroidism, unspecified: Secondary | ICD-10-CM | POA: Diagnosis not present

## 2019-09-23 DIAGNOSIS — E669 Obesity, unspecified: Secondary | ICD-10-CM | POA: Diagnosis not present

## 2019-09-23 DIAGNOSIS — Z79899 Other long term (current) drug therapy: Secondary | ICD-10-CM | POA: Diagnosis not present

## 2019-09-23 DIAGNOSIS — Z79890 Hormone replacement therapy (postmenopausal): Secondary | ICD-10-CM | POA: Diagnosis not present

## 2019-09-23 DIAGNOSIS — Z6841 Body Mass Index (BMI) 40.0 and over, adult: Secondary | ICD-10-CM | POA: Diagnosis not present

## 2019-09-23 DIAGNOSIS — K219 Gastro-esophageal reflux disease without esophagitis: Secondary | ICD-10-CM | POA: Diagnosis not present

## 2019-09-23 DIAGNOSIS — Z01812 Encounter for preprocedural laboratory examination: Secondary | ICD-10-CM | POA: Diagnosis not present

## 2019-09-24 LAB — NOVEL CORONAVIRUS, NAA (HOSP ORDER, SEND-OUT TO REF LAB; TAT 18-24 HRS): SARS-CoV-2, NAA: NOT DETECTED

## 2019-09-27 ENCOUNTER — Ambulatory Visit (HOSPITAL_COMMUNITY): Payer: 59 | Admitting: Registered Nurse

## 2019-09-27 ENCOUNTER — Encounter (HOSPITAL_COMMUNITY)
Admission: RE | Disposition: A | Discharge status: Home / Self Care | Payer: Self-pay | Source: Home / Self Care | Attending: Gastroenterology

## 2019-09-27 ENCOUNTER — Encounter (HOSPITAL_COMMUNITY): Payer: Self-pay | Admitting: Gastroenterology

## 2019-09-27 ENCOUNTER — Ambulatory Visit (HOSPITAL_COMMUNITY)
Admission: RE | Admit: 2019-09-27 | Discharge: 2019-09-27 | Disposition: A | Discharge status: Home / Self Care | Payer: 59 | Attending: Gastroenterology | Admitting: Gastroenterology

## 2019-09-27 ENCOUNTER — Other Ambulatory Visit: Payer: Self-pay

## 2019-09-27 DIAGNOSIS — E039 Hypothyroidism, unspecified: Secondary | ICD-10-CM | POA: Insufficient documentation

## 2019-09-27 DIAGNOSIS — Z86010 Personal history of colonic polyps: Secondary | ICD-10-CM | POA: Diagnosis not present

## 2019-09-27 DIAGNOSIS — D122 Benign neoplasm of ascending colon: Secondary | ICD-10-CM | POA: Diagnosis not present

## 2019-09-27 DIAGNOSIS — Z01812 Encounter for preprocedural laboratory examination: Secondary | ICD-10-CM | POA: Insufficient documentation

## 2019-09-27 DIAGNOSIS — D120 Benign neoplasm of cecum: Secondary | ICD-10-CM

## 2019-09-27 DIAGNOSIS — Z6841 Body Mass Index (BMI) 40.0 and over, adult: Secondary | ICD-10-CM | POA: Insufficient documentation

## 2019-09-27 DIAGNOSIS — Z1211 Encounter for screening for malignant neoplasm of colon: Secondary | ICD-10-CM | POA: Diagnosis not present

## 2019-09-27 DIAGNOSIS — E538 Deficiency of other specified B group vitamins: Secondary | ICD-10-CM | POA: Insufficient documentation

## 2019-09-27 DIAGNOSIS — Z79890 Hormone replacement therapy (postmenopausal): Secondary | ICD-10-CM | POA: Insufficient documentation

## 2019-09-27 DIAGNOSIS — K219 Gastro-esophageal reflux disease without esophagitis: Secondary | ICD-10-CM | POA: Insufficient documentation

## 2019-09-27 DIAGNOSIS — E669 Obesity, unspecified: Secondary | ICD-10-CM | POA: Insufficient documentation

## 2019-09-27 DIAGNOSIS — Z20828 Contact with and (suspected) exposure to other viral communicable diseases: Secondary | ICD-10-CM | POA: Insufficient documentation

## 2019-09-27 DIAGNOSIS — Z79899 Other long term (current) drug therapy: Secondary | ICD-10-CM | POA: Insufficient documentation

## 2019-09-27 DIAGNOSIS — K635 Polyp of colon: Secondary | ICD-10-CM | POA: Insufficient documentation

## 2019-09-27 HISTORY — PX: BIOPSY: SHX5522

## 2019-09-27 HISTORY — PX: POLYPECTOMY: SHX5525

## 2019-09-27 HISTORY — PX: COLONOSCOPY WITH PROPOFOL: SHX5780

## 2019-09-27 SURGERY — COLONOSCOPY WITH PROPOFOL
Anesthesia: Monitor Anesthesia Care

## 2019-09-27 MED ORDER — LIDOCAINE 2% (20 MG/ML) 5 ML SYRINGE
INTRAMUSCULAR | Status: DC | PRN
  Administered 2019-09-27: 100 mg via INTRAVENOUS

## 2019-09-27 MED ORDER — LACTATED RINGERS IV SOLN
INTRAVENOUS | Status: DC
  Administered 2019-09-27: 1000 mL via INTRAVENOUS

## 2019-09-27 MED ORDER — PROPOFOL 500 MG/50ML IV EMUL
INTRAVENOUS | Status: AC
  Filled 2019-09-27: qty 50

## 2019-09-27 MED ORDER — PROPOFOL 10 MG/ML IV BOLUS
INTRAVENOUS | Status: DC | PRN
  Administered 2019-09-27: 30 mg via INTRAVENOUS
  Administered 2019-09-27: 20 mg via INTRAVENOUS
  Administered 2019-09-27 (×4): 30 mg via INTRAVENOUS
  Administered 2019-09-27 (×3): 20 mg via INTRAVENOUS
  Administered 2019-09-27 (×7): 30 mg via INTRAVENOUS
  Administered 2019-09-27 (×2): 20 mg via INTRAVENOUS
  Administered 2019-09-27 (×3): 30 mg via INTRAVENOUS
  Administered 2019-09-27: 20 mg via INTRAVENOUS

## 2019-09-27 MED ORDER — SODIUM CHLORIDE 0.9 % IV SOLN: INTRAVENOUS | Status: DC

## 2019-09-27 MED ORDER — PROPOFOL 10 MG/ML IV BOLUS
INTRAVENOUS | Status: AC
  Filled 2019-09-27: qty 20

## 2019-09-27 SURGICAL SUPPLY — 22 items

## 2019-09-27 NOTE — H&P (Signed)
History:  This patient presents for endoscopic testing for Hx colon polyp.  Crystal Wall Referring physician: Jilda Panda, MD  Past Medical History: Past Medical History:  Diagnosis Date  . Allergic rhinitis   . Allergy   . B12 deficiency   . GERD (gastroesophageal reflux disease)   . Hemorrhoids   . Hiatal hernia    as noted on barium swallow  . History of gestational diabetes   . Hyperplastic colon polyp   . Hypothyroidism   . Microcytic anemia   . Obesity   . Thyroid disease    hypothyroid     Past Surgical History: Past Surgical History:  Procedure Laterality Date  . CESAREAN SECTION  2001  . COLONOSCOPY N/A 10/24/2013   Procedure: COLONOSCOPY;  Surgeon: Inda Castle, MD;  Location: [facility];  Service: Endoscopy;  Laterality: N/A;  . COLONOSCOPY WITH PROPOFOL N/A 08/10/2018   Procedure: COLONOSCOPY WITH PROPOFOL;  Surgeon: Doran Stabler, MD;  Location: [facility];  Service: Gastroenterology;  Laterality: N/A;  . GASTRIC BYPASS  2007  . HYSTEROSCOPY  08/2010  . POLYPECTOMY  08/10/2018   Procedure: POLYPECTOMY;  Surgeon: Doran Stabler, MD;  Location: [facility];  Service: Gastroenterology;;    Allergies: No Known Allergies  Outpatient Meds: Current Facility-Administered Medications  Medication Dose Route Frequency Provider Last Rate Last Admin  . 0.9 %  sodium chloride infusion   Intravenous Continuous Nelida Meuse III, MD          ___________________________________________________________________ Objective   Exam:  LMP 09/05/2014    CV: RRR without murmur, S1/S2, no JVD, no peripheral edema  Resp: clear to auscultation bilaterally, normal RR and effort noted  GI: soft, no tenderness, with active bowel sounds. No guarding or palpable organomegaly noted.  Neuro: awake, alert and oriented x 3. Normal gross motor function and fluent speech   Assessment:  Hx colon polyp  Plan:  colonoscopy   Nelida Meuse  III

## 2019-09-27 NOTE — Interval H&P Note (Signed)
History and Physical Interval Note:  09/27/2019 8:14 AM  Crystal Wall  has presented today for surgery, with the diagnosis of Surveillance colonoscopy.  The various methods of treatment have been discussed with the patient and family. After consideration of risks, benefits and other options for treatment, the patient has consented to  Procedure(s): COLONOSCOPY WITH PROPOFOL (N/A) as a surgical intervention.  The patient's history has been reviewed, patient examined, no change in status, stable for surgery.  I have reviewed the patient's chart and labs.  Questions were answered to the patient's satisfaction.     Nelida Meuse III

## 2019-09-27 NOTE — Anesthesia Procedure Notes (Signed)
Date/Time: 09/27/2019 8:43 AM Performed by: Talbot Grumbling, CRNA Oxygen Delivery Method: Simple face mask

## 2019-09-27 NOTE — Discharge Instructions (Signed)
YOU HAD AN ENDOSCOPIC PROCEDURE TODAY: Refer to the procedure report and other information in the discharge instructions given to you for any specific questions about what was found during the examination. If this information does not answer your questions, please call [place] office at [phone] to clarify.   YOU SHOULD EXPECT: Some feelings of bloating in the abdomen. Passage of more gas than usual. Walking can help get rid of the air that was put into your GI tract during the procedure and reduce the bloating. If you had a lower endoscopy (such as a colonoscopy or flexible sigmoidoscopy) you may notice spotting of blood in your stool or on the toilet paper. Some abdominal soreness may be present for a day or two, also.  DIET: Your first meal following the procedure should be a light meal and then it is ok to progress to your normal diet. A half-sandwich or bowl of soup is an example of a good first meal. Heavy or fried foods are harder to digest and may make you feel nauseous or bloated. Drink plenty of fluids but you should avoid alcoholic beverages for 24 hours. If you had a esophageal dilation, please see attached instructions for diet.    ACTIVITY: Your care partner should take you home directly after the procedure. You should plan to take it easy, moving slowly for the rest of the day. You can resume normal activity the day after the procedure however YOU SHOULD NOT DRIVE, use power tools, machinery or perform tasks that involve climbing or major physical exertion for 24 hours (because of the sedation medicines used during the test).   SYMPTOMS TO REPORT IMMEDIATELY: A gastroenterologist can be reached at any hour. Please call [phone]  for any of the following symptoms:  Following lower endoscopy (colonoscopy, flexible sigmoidoscopy) Excessive amounts of blood in the stool  Significant tenderness, worsening of abdominal pains  Swelling of the abdomen that is new, acute  Fever of 100 or  higher  Following upper endoscopy (EGD, EUS, ERCP, esophageal dilation) Vomiting of blood or coffee ground material  New, significant abdominal pain  New, significant chest pain or pain under the shoulder blades  Painful or persistently difficult swallowing  New shortness of breath  Black, tarry-looking or red, bloody stools  FOLLOW UP:  If any biopsies were taken you will be contacted by phone or by letter within the next 1-3 weeks. Call [phone]  if you have not heard about the biopsies in 3 weeks.  Please also call with any specific questions about appointments or follow up tests.  Hospital Summary Crystal Wall Date of birth: 1965/12/11  09/27/2019 [facility] [phone]             Medication Overview No changes were made to your medications.                        Your Primary Care Provider Jilda Panda, MD    You are allergic to the following You are allergic to the following  No active allergies                       To Do List To Do List  [id number] LAB MO  Thursday Oct 13, 2019 9:00 [facility]  [place]  [id number] [address]  [phone]   Established Patient 20 with Truitt Merle, MD  Thursday Oct 13, 2019  9:20 AM  [facility]  [place]  [id number] [place] [address]  [phone]  Medications You Will Be Given Medications You Will Be Given  [phone] levonorgestrel (Mirena (52 MG))  Next due Wednesday April 1 (Overdue)  Expected: once (1 dose remaining)              Immunizations Administered for This Admission No immunizations on file.           Current Medication List-Note: Take only these medications. Stop taking all medications not included on this list. If you have any questions regarding medications not on this list, please follow up with your healthcare  provider. Bring this list to your next appointment. Current Medication List-Note: Take only these medications. Stop taking all medications not included on this list. If you have any questions regarding medications not on this list, please follow up with your healthcare provider. Bring this list to your next appointment.    Medication Details Next Dose Due Morning Afternoon Evening Bedtime As Needed  CALCIUM 500 PO  Take 500 mg by mouth 2 (two) times daily.         cyanocobalamin 1000 MCG/ML injection  Commonly known as: (VITAMIN B-12)  Inject 1,000 mcg into the muscle every 30 (thirty) days.         ibuprofen 200 MG tablet  Commonly known as: ADVIL  Take 400 mg by mouth every 6 (six) hours as needed for headache or moderate pain.         Iron 325 (65 Fe) MG Tabs  Take 65 mg by mouth daily.         multivitamin tablet  Take 1 tablet by mouth daily.         omeprazole 20 MG capsule  Commonly known as: PRILOSEC  Take 20 mg by mouth daily as needed (acid reflux).         Synthroid 137 MCG tablet  Take 137 mcg by mouth daily before breakfast.  Generic drug: levothyroxine         vitamin C 500 MG tablet  Commonly known as: ASCORBIC ACID  Take 500 mg by mouth daily.                                    MyChart We now offer e-Visits for anyone 68 and older to request care online for non-urgent symptoms. For details visit [email or web address].  Also download the MyChart app! Go to the app store, search "MyChart", open the app, select [place], and log in with your MyChart username and password.    Instructions YOU HAD AN ENDOSCOPIC PROCEDURE TODAY: Refer to the procedure report and other information in the discharge instructions given to you for any specific questions about what was found during the examination. If this information does not answer your questions, please call [place] office at [phone] to clarify.  YOU SHOULD EXPECT: Some feelings of bloating in the  abdomen. Passage of more gas than usual. Walking can help get rid of the air that was put into your GI tract during the procedure and reduce the bloating. If you had a lower endoscopy (such as a colonoscopy or flexible sigmoidoscopy) you may notice spotting of blood in your stool or on the toilet paper. Some abdominal soreness may be present for a day or two, also.  DIET: Your first meal following the procedure should be  a light meal and then it is ok to progress to your normal diet. A half-sandwich or bowl of soup is an example of a good first meal. Heavy or fried foods are harder to digest and may make you feel nauseous or bloated. Drink plenty of fluids but you should avoid alcoholic beverages for 24 hours. If you had a esophageal dilation, please see attached instructions for diet.  ACTIVITY: Your care partner should take you home directly after the procedure. You should plan to take it easy, moving slowly for the rest of the day. You can resume normal activity the day after the procedure however YOU SHOULD NOT DRIVE, use power tools, machinery or perform tasks that involve climbing or major physical exertion for 24 hours (because of the sedation medicines used during the test).  SYMPTOMS TO REPORT IMMEDIATELY:  A gastroenterologist can be reached at any hour. Please call [phone] for any of the following symptoms:  Following lower endoscopy (colonoscopy, flexible sigmoidoscopy) Excessive amounts of blood in the stool  Significant tenderness, worsening of abdominal pains  Swelling of the abdomen that is new, acute  Fever of 100 or higher  Following upper endoscopy (EGD, EUS, ERCP, esophageal dilation) Vomiting of blood or coffee ground material  New, significant abdominal pain  New, significant chest pain or pain under the shoulder blades  Painful or persistently difficult swallowing  New shortness of breath  Black, tarry-looking or red, bloody stools  FOLLOW UP:  If any biopsies were  taken you will be contacted by phone or by letter within the next 1-3 weeks. Call [phone] if you have not heard about the biopsies in 3 weeks.  Please also call with any specific questions about appointments or follow up tests.          Suicide Resources  Who to Call  Call [place] 1-800-SUICIDE or (800) (701)379-9399) [place] at 305-215-6760; 559-864-0811 More Resources  Suicide Awareness Voices of Education (864)597-7014  www.save.[place] on Mental Illness(NAMI) (800) 950-NAMI  www.nami.org  American Association of Suicidology 2627739680  www.suicidology.org          ?        Care Everywhere ID: [id number]      Discharge instructions (including medications) discussed with and copy provided to patient/caregiver

## 2019-09-27 NOTE — Transfer of Care (Signed)
Immediate Anesthesia Transfer of Care Note  Patient: Crystal Wall  Procedure(s) Performed: COLONOSCOPY WITH PROPOFOL (N/A ) POLYPECTOMY BIOPSY  Patient Location: PACU  Anesthesia Type:MAC  Level of Consciousness: sedated  Airway & Oxygen Therapy: Patient Spontanous Breathing and Patient connected to face mask oxygen  Post-op Assessment: Report given to RN and Post -op Vital signs reviewed and stable  Post vital signs: Reviewed and stable  Last Vitals:  Vitals Value Taken Time  BP    Temp    Pulse    Resp    SpO2      Last Pain:  Vitals:   09/27/19 0813  TempSrc: Oral  PainSc: 0-No pain         Complications: No apparent anesthesia complications

## 2019-09-27 NOTE — Anesthesia Postprocedure Evaluation (Signed)
Anesthesia Post Note  Patient: Crystal Wall  Procedure(s) Performed: COLONOSCOPY WITH PROPOFOL (N/A ) POLYPECTOMY BIOPSY     Patient location during evaluation: Endoscopy Anesthesia Type: MAC Level of consciousness: awake and alert Pain management: pain level controlled Vital Signs Assessment: post-procedure vital signs reviewed and stable Respiratory status: spontaneous breathing, nonlabored ventilation, respiratory function stable and patient connected to nasal cannula oxygen Cardiovascular status: stable and blood pressure returned to baseline Postop Assessment: no apparent nausea or vomiting Anesthetic complications: no    Last Vitals:  Vitals:   09/27/19 1020 09/27/19 1025  BP:    Pulse: (!) 44 (!) 42  Resp: 18 16  Temp:    SpO2: 100% 100%    Last Pain:  Vitals:   09/27/19 0950  TempSrc:   PainSc: 0-No pain                 Laddie Math

## 2019-09-27 NOTE — Op Note (Signed)
[facility] Patient Name: Crystal Wall Procedure Date: 09/27/2019 MRN: [id number] Attending MD: Estill Cotta. Loletha Carrow , MD Date of Birth: 03-18-1966 CSN: [id number] Age: 53 Admit Type: Outpatient Procedure:                Colonoscopy Indications:              High risk colon cancer surveillance: Personal                            history of sessile serrated colon polyp (10 mm or                            greater in size - 08/2018) Providers:                Estill Cotta. Loletha Carrow, MD, Cleda Daub, RN, Corie Chiquito,                            Technician, Marguerita Merles, Technician, Marla Roe, CRNA Referring MD:              Medicines:                Monitored Anesthesia Care Complications:            No immediate complications. Estimated Blood Loss:     Estimated blood loss was minimal. Procedure:                Pre-Anesthesia Assessment:                           - Prior to the procedure, a History and Physical                            was performed, and patient medications and                            allergies were reviewed. The patient's tolerance of                            previous anesthesia was also reviewed. The risks                            and benefits of the procedure and the sedation                            options and risks were discussed with the patient.                            All questions were answered, and informed consent                            was obtained. Prior Anticoagulants: The patient has  taken no previous anticoagulant or antiplatelet                            agents. ASA Grade Assessment: III - A patient with                            severe systemic disease. After reviewing the risks                            and benefits, the patient was deemed in                            satisfactory condition to undergo the procedure.                           After obtaining  informed consent, the colonoscope                            was passed under direct vision. Throughout the                            procedure, the patient's blood pressure, pulse, and                            oxygen saturations were monitored continuously. The                            CF-HQ190L ([id number]) Olympus colonoscope was                            introduced through the anus and advanced to the the                            cecum, identified by appendiceal orifice and                            ileocecal valve. The colonoscopy was performed                            without difficulty. The patient tolerated the                            procedure well. The quality of the bowel                            preparation was good. The ileocecal valve,                            appendiceal orifice, and rectum were photographed. Scope In: 8:51:18 AM Scope Out: 9:31:43 AM Scope Withdrawal Time: 0 hours 35 minutes 35 seconds  Total Procedure Duration: 0 hours 40 minutes 25 seconds  Findings:      The perianal and digital rectal examinations were normal.      A post polypectomy scar was found in the cecum.  6-60m area of nodular       mucosa (? polyp). Biopsies were taken with a cold forceps for histology       to remove the nodular mucosa.      A 15 mm polyp was found in the proximal ascending colon. The polyp was       flat with a mucus cap. The polyp was removed with a piecemeal technique       using a cold snare. Resection and retrieval were complete.      A 8 mm polyp was found in the mid ascending colon. The polyp was flat       with a mucus cap. The polyp was removed with a cold snare. Resection and       retrieval were complete.      The exam was otherwise without abnormality on direct and retroflexion       views. Impression:               - Post-polypectomy scar in the cecum. Biopsied.                           - One 15 mm polyp in the proximal ascending colon,                             removed piecemeal using a cold snare. Resected and                            retrieved.                           - One 8 mm polyp in the mid ascending colon,                            removed with a cold snare. Resected and retrieved.                           - The examination was otherwise normal on direct                            and retroflexion views. Moderate Sedation:      MAC sedation used Recommendation:           - Patient has a contact number available for                            emergencies. The signs and symptoms of potential                            delayed complications were discussed with the                            patient. Return to normal activities tomorrow.                            Written discharge instructions were provided to the  patient.                           - Resume previous diet.                           - Continue present medications.                           - Await pathology results.                           - Repeat colonoscopy is recommended for                            surveillance. The colonoscopy date will be                            determined after pathology results from today's                            exam become available for review. Procedure Code(s):        --- Professional ---                           [phone], Colonoscopy, flexible; with removal of                            tumor(s), polyp(s), or other lesion(s) by snare                            technique                           45380, 26, Colonoscopy, flexible; with biopsy,                            single or multiple Diagnosis Code(s):        --- Professional ---                           Z86.010, Personal history of colonic polyps                           Z98.890, Other specified postprocedural states                           K63.5, Polyp of colon CPT copyright 2019 American Medical Association. All rights  reserved. The codes documented in this report are preliminary and upon coder review may  be revised to meet current compliance requirements. Ginevra Tacker L. Loletha Carrow, MD 09/27/2019 9:40:04 AM This report has been signed electronically. Number of Addenda: 0

## 2019-09-27 NOTE — Anesthesia Preprocedure Evaluation (Signed)
Anesthesia Evaluation  Patient identified by MRN, date of birth, ID band Patient awake    Reviewed: Allergy & Precautions, NPO status , Patient's Chart, lab work & pertinent test results  History of Anesthesia Complications Negative for: history of anesthetic complications  Airway Mallampati: III  TM Distance: >3 FB Neck ROM: Full    Dental  (+) Dental Advisory Given, Teeth Intact   Pulmonary neg recent URI,    breath sounds clear to auscultation       Cardiovascular negative cardio ROS   Rhythm:Regular     Neuro/Psych negative neurological ROS  negative psych ROS   GI/Hepatic Neg liver ROS, hiatal hernia, GERD  Medicated and Controlled,  Endo/Other  Hypothyroidism   Renal/GU negative Renal ROS     Musculoskeletal negative musculoskeletal ROS (+)   Abdominal   Peds  Hematology negative hematology ROS (+)   Anesthesia Other Findings   Reproductive/Obstetrics                             Anesthesia Physical Anesthesia Plan  ASA: III  Anesthesia Plan: MAC   Post-op Pain Management:    Induction: Intravenous  PONV Risk Score and Plan: 2 and Treatment may vary due to age or medical condition and Propofol infusion  Airway Management Planned: Nasal Cannula  Additional Equipment: None  Intra-op Plan:   Post-operative Plan:   Informed Consent: I have reviewed the patients History and Physical, chart, labs and discussed the procedure including the risks, benefits and alternatives for the proposed anesthesia with the patient or authorized representative who has indicated his/her understanding and acceptance.     Dental advisory given  Plan Discussed with: CRNA and Surgeon  Anesthesia Plan Comments:         Anesthesia Quick Evaluation

## 2019-09-27 NOTE — Progress Notes (Signed)
Pt Sinus brady cardia running in the 40's and dropping to 30's. Pt denies symptoms was 55 on admission. Dr. Ermalene Postin by to see pt. He is fine with her leaving VSS as long as while patient sitting up feels fine.  Will assess patient sitting up and discharge as necessary.

## 2019-09-27 NOTE — Progress Notes (Signed)
Pt sitting up to dress and feels ok. No dizziness or weak feeling. Pt states she is feeling fine.

## 2019-09-28 ENCOUNTER — Encounter: Payer: Self-pay | Admitting: *Deleted

## 2019-09-28 LAB — SURGICAL PATHOLOGY

## 2019-09-29 ENCOUNTER — Encounter: Payer: Self-pay | Admitting: Gastroenterology

## 2019-10-06 ENCOUNTER — Telehealth: Payer: Self-pay | Admitting: Hematology

## 2019-10-06 NOTE — Telephone Encounter (Signed)
Returned patient's phone call regarding rescheduling 01/07 appointment, appointment ha\s moved to 01/13 per patient's request.

## 2019-10-13 ENCOUNTER — Ambulatory Visit: Payer: 59 | Admitting: Hematology

## 2019-10-13 ENCOUNTER — Other Ambulatory Visit: Payer: 59

## 2019-10-19 NOTE — Progress Notes (Signed)
[facility]   [phone] [phone]   Clinic Follow up Note   Patient Care Team: Jilda Panda, MD as PCP - General (Internal Medicine)  Date of Service:  10/21/2019  CHIEF COMPLAINT: Iron deficiency anemia  CURRENT THERAPY:  -Oral ferrous sulfate 65mg  daily  -IV Ferahame as needed -Monthly B12 injections at home   INTERVAL HISTORY:  Crystal Wall is here for a follow up of IDA. She was last seen by me 1 year ago. She presents to the clinic alone. She notes she had another colonoscopy in 09/2019. 2 polyps was found, 1 which was 103mm. She plans to repeat a year later. She denies any bleeding. She notes she still takes B12 injections at home. She feels her energy is stable and adequately she has been able to exercise daily for the past 20 days. She is still working on weight loss.  She sees her PCP twice a year and her Gyn once a year.     REVIEW OF SYSTEMS:   Constitutional: Denies fevers, chills or abnormal weight loss Eyes: Denies blurriness of vision Ears, nose, mouth, throat, and face: Denies mucositis or sore throat Respiratory: Denies cough, dyspnea or wheezes Cardiovascular: Denies palpitation, chest discomfort or lower extremity swelling Gastrointestinal:  Denies nausea, heartburn or change in bowel habits Skin: Denies abnormal skin rashes Lymphatics: Denies new lymphadenopathy or easy bruising Neurological:Denies numbness, tingling or new weaknesses Behavioral/Psych: Mood is stable, no new changes  All other systems were reviewed with the patient and are negative.  MEDICAL HISTORY:  Past Medical History:  Diagnosis Date  . Allergic rhinitis   . Allergy   . B12 deficiency   . GERD (gastroesophageal reflux disease)   . Hemorrhoids   . Hiatal hernia    as noted on barium swallow  . History of gestational diabetes   . Hyperplastic colon polyp   . Hypothyroidism   . Microcytic anemia   . Obesity   . Thyroid disease    hypothyroid    SURGICAL HISTORY: Past Surgical History:  Procedure Laterality Date  . BIOPSY  09/27/2019   Procedure: BIOPSY;  Surgeon: Doran Stabler, MD;  Location: [facility];  Service: Gastroenterology;;  . CESAREAN SECTION  2001  . COLONOSCOPY N/A 10/24/2013   Procedure: COLONOSCOPY;  Surgeon: Inda Castle, MD;  Location: [facility];  Service: Endoscopy;  Laterality: N/A;  . COLONOSCOPY WITH PROPOFOL N/A 08/10/2018   Procedure: COLONOSCOPY WITH PROPOFOL;  Surgeon: Doran Stabler, MD;  Location: [facility];  Service: Gastroenterology;  Laterality: N/A;  . COLONOSCOPY WITH PROPOFOL N/A 09/27/2019   Procedure: COLONOSCOPY WITH PROPOFOL;  Surgeon: Doran Stabler, MD;  Location: [facility];  Service: Gastroenterology;  Laterality: N/A;  . GASTRIC BYPASS  2007  . HYSTEROSCOPY  08/2010  . POLYPECTOMY  08/10/2018   Procedure: POLYPECTOMY;  Surgeon: Doran Stabler, MD;  Location: [facility];  Service: Gastroenterology;;  . POLYPECTOMY  09/27/2019   Procedure: POLYPECTOMY;  Surgeon: Doran Stabler, MD;  Location: [facility];  Service: Gastroenterology;;    I have reviewed the social history and family history with the patient and they are unchanged from previous note.  ALLERGIES:  has No Known Allergies.  MEDICATIONS:  Current Outpatient Medications  Medication Sig Dispense Refill  . Calcium Carbonate (CALCIUM 500 PO) Take 500 mg by mouth 2 (two) times daily.     . cyanocobalamin (,VITAMIN B-12,) 1000 MCG/ML injection Inject 1,000 mcg into the  muscle every 30 (thirty) days.     . Ferrous Sulfate (IRON) 325 (65 Fe) MG TABS Take 65 mg by mouth daily.    Marland Kitchen ibuprofen (ADVIL,MOTRIN) 200 MG tablet Take 400 mg by mouth every 6 (six) hours as needed for headache or moderate pain.     . Multiple Vitamin (MULTIVITAMIN) tablet Take 1 tablet by mouth daily.      Marland Kitchen omeprazole (PRILOSEC) 20 MG capsule Take 20 mg by mouth daily as needed (acid reflux).    . SYNTHROID  137 MCG tablet Take 137 mcg by mouth daily before breakfast.   6  . vitamin C (ASCORBIC ACID) 500 MG tablet Take 500 mg by mouth daily.     Current Facility-Administered Medications  Medication Dose Route Frequency Provider Last Rate Last Admin  . levonorgestrel (MIRENA) 20 MCG/24HR IUD   Intrauterine Once Fontaine, Belinda Block, MD        PHYSICAL EXAMINATION: ECOG PERFORMANCE STATUS: 0 - Asymptomatic  Vitals:   10/21/19 0954 10/21/19 0955  BP: (!) 164/72 (!) 150/78  Pulse: 63   Resp: 18   Temp: 97.8 F (36.6 C)   SpO2: 97%    Filed Weights   10/21/19 0954  Weight: 284 lb 8 oz (129 kg)    Due to COVID19 we will limit examination to appearance. Patient had no complaints.  GENERAL:alert, no distress and comfortable SKIN: skin color normal, no rashes or significant lesions EYES: normal, Conjunctiva are pink and non-injected, sclera clear  NEURO: alert & oriented x 3 with fluent speech   LABORATORY DATA:  I have reviewed the data as listed CBC Latest Ref Rng & Units 10/21/2019 04/26/2019 11/26/2018  WBC 4.0 - 10.5 K/uL 5.0 4.8 6.0  Hemoglobin 12.0 - 15.0 g/dL 13.5 13.8 13.5  Hematocrit 36.0 - 46.0 % 40.2 40.7 41.0  Platelets 150 - 400 K/uL 219 194 207     No flowsheet data found.    RADIOGRAPHIC STUDIES: I have personally reviewed the radiological images as listed and agreed with the findings in the report. No results found.   ASSESSMENT & PLAN:  Crystal Wall is a 54 y.o. female with    1. Iron deficiency, B12 deficiency with mild anemia, secondary to previous gastric bypass surgery  -She was seen by [place] initially in Sep 2019 when she had mild anemia and chronic iron deficiency. She had gastric bypass surgery around  around 2007.  -She was treated with 2 doses of IV Feraheme in 06/2018 and had responded very well. She is currently on oral iron and monthly B12 injections at home.  -Her colonoscopies with Dr Loletha Carrow in 2019 and 2020 have had 24mm polyps. She will repeat  in 2021. She denies any bleeding.   -Labs reviewed, anemia remains resolved. Her iron panel and B12 is still pending. I discussed given her labs have been consistently stable she is fine to continue monitoring labs with her PCP. If she develops anemia again, her B12 level drops or Ferritin <30 I will see her again.  -Continue monthly B12 injections and oral iron with orange juice. She is fine to take Vit D. F/u as needed in the future.      PLAN:  -Copy note to Dr. Mellody Drown -lab reviewed, no anemia, she has not required any iv iron since 2019 -She will continue oral iron and B12 injection at home -She will check her CBC and iron study, B12 level twice a year with Dr. Mellody Drown  -F/u as needed in  the future. She knows to call me    No problem-specific Assessment & Plan notes found for this encounter.   No orders of the defined types were placed in this encounter.  All questions were answered. The patient knows to call the clinic with any problems, questions or concerns. No barriers to learning was detected. The total time spent in the appointment was 20 minutes.     Truitt Merle, MD 10/21/2019   I, Joslyn Devon, am acting as scribe for Truitt Merle, MD.   I have reviewed the above documentation for accuracy and completeness, and I agree with the above.

## 2019-10-21 ENCOUNTER — Inpatient Hospital Stay: Payer: 59 | Attending: Hematology

## 2019-10-21 ENCOUNTER — Encounter: Payer: Self-pay | Admitting: Hematology

## 2019-10-21 ENCOUNTER — Telehealth: Payer: Self-pay | Admitting: Hematology

## 2019-10-21 ENCOUNTER — Inpatient Hospital Stay (HOSPITAL_BASED_OUTPATIENT_CLINIC_OR_DEPARTMENT_OTHER): Payer: 59 | Admitting: Hematology

## 2019-10-21 ENCOUNTER — Other Ambulatory Visit: Payer: Self-pay

## 2019-10-21 VITALS — BP 150/78 | HR 63 | Temp 97.8°F | Resp 18 | Ht 62.0 in | Wt 284.5 lb

## 2019-10-21 DIAGNOSIS — D509 Iron deficiency anemia, unspecified: Secondary | ICD-10-CM

## 2019-10-21 DIAGNOSIS — E611 Iron deficiency: Secondary | ICD-10-CM | POA: Diagnosis not present

## 2019-10-21 DIAGNOSIS — D508 Other iron deficiency anemias: Secondary | ICD-10-CM

## 2019-10-21 DIAGNOSIS — Z9884 Bariatric surgery status: Secondary | ICD-10-CM | POA: Insufficient documentation

## 2019-10-21 DIAGNOSIS — E538 Deficiency of other specified B group vitamins: Secondary | ICD-10-CM | POA: Insufficient documentation

## 2019-10-21 LAB — IRON AND TIBC
Iron: 91 ug/dL (ref 41–142)
Saturation Ratios: 26 % (ref 21–57)
TIBC: 356 ug/dL (ref 236–444)
UIBC: 265 ug/dL (ref 120–384)

## 2019-10-21 LAB — CBC WITH DIFFERENTIAL (CANCER CENTER ONLY)
Abs Immature Granulocytes: 0.02 10*3/uL (ref 0.00–0.07)
Basophils Absolute: 0 10*3/uL (ref 0.0–0.1)
Basophils Relative: 1 %
Eosinophils Absolute: 0.1 10*3/uL (ref 0.0–0.5)
Eosinophils Relative: 2 %
HCT: 40.2 % (ref 36.0–46.0)
Hemoglobin: 13.5 g/dL (ref 12.0–15.0)
Immature Granulocytes: 0 %
Lymphocytes Relative: 27 %
Lymphs Abs: 1.4 10*3/uL (ref 0.7–4.0)
MCH: 30.9 pg (ref 26.0–34.0)
MCHC: 33.6 g/dL (ref 30.0–36.0)
MCV: 92 fL (ref 80.0–100.0)
Monocytes Absolute: 0.2 10*3/uL (ref 0.1–1.0)
Monocytes Relative: 5 %
Neutro Abs: 3.3 10*3/uL (ref 1.7–7.7)
Neutrophils Relative %: 65 %
Platelet Count: 219 10*3/uL (ref 150–400)
RBC: 4.37 MIL/uL (ref 3.87–5.11)
RDW: 13.4 % (ref 11.5–15.5)
WBC Count: 5 10*3/uL (ref 4.0–10.5)
nRBC: 0 % (ref 0.0–0.2)

## 2019-10-21 LAB — FERRITIN: Ferritin: 21 ng/mL (ref 11–307)

## 2019-10-21 LAB — VITAMIN B12: Vitamin B-12: 502 pg/mL (ref 180–914)

## 2019-10-21 NOTE — Telephone Encounter (Signed)
Per 1/15 los f/u open

## 2019-10-26 ENCOUNTER — Encounter: Payer: Self-pay | Admitting: Hematology

## 2020-02-15 ENCOUNTER — Ambulatory Visit: Payer: Self-pay

## 2020-02-15 ENCOUNTER — Ambulatory Visit: Payer: 59 | Admitting: Orthopedic Surgery

## 2020-02-15 ENCOUNTER — Other Ambulatory Visit: Payer: Self-pay

## 2020-02-15 VITALS — Ht 62.0 in | Wt 284.0 lb

## 2020-02-15 DIAGNOSIS — M79604 Pain in right leg: Secondary | ICD-10-CM

## 2020-02-16 ENCOUNTER — Ambulatory Visit: Payer: Self-pay

## 2020-02-16 ENCOUNTER — Encounter: Payer: Self-pay | Admitting: Physical Medicine and Rehabilitation

## 2020-02-16 ENCOUNTER — Ambulatory Visit (INDEPENDENT_AMBULATORY_CARE_PROVIDER_SITE_OTHER): Payer: 59 | Admitting: Physical Medicine and Rehabilitation

## 2020-02-16 DIAGNOSIS — M25551 Pain in right hip: Secondary | ICD-10-CM

## 2020-02-16 NOTE — Progress Notes (Signed)
Pt states pain in the right hip (groin pain). Pt states pain started a few weeks ago. Sitting for a long time makes pain worse. Walking helps with pain.   .Numeric Pain Rating Scale and Functional Assessment Average Pain 5   In the last MONTH (on 0-10 scale) has pain interfered with the following?  1. General activity like being  able to carry out your everyday physical activities such as walking, climbing stairs, carrying groceries, or moving a chair?  Rating(5)    -Dye Allergies.

## 2020-02-16 NOTE — Progress Notes (Signed)
Crystal Wall - 54 y.o. female MRN [id number]  Date of birth: Jan 28, 1966  Office Visit Note: Visit Date: 02/16/2020 PCP: Jilda Panda, MD Referred by: Jilda Panda, MD  Subjective: Chief Complaint  Patient presents with  . Right Hip - Pain   HPI:  Crystal Wall is a 55 y.o. female who comes in today for planned Right anesthetic hip arthrogram with fluoroscopic guidance.  The patient has failed conservative care including home exercise, medications, time and activity modification.  This injection will be diagnostic and hopefully therapeutic.  Please see requesting physician notes for further details and justification.   ROS Otherwise per HPI.  Assessment & Plan: Visit Diagnoses:  1. Pain in right hip     Plan: No additional findings.   Meds & Orders: No orders of the defined types were placed in this encounter.   Orders Placed This Encounter  Procedures  . Large Joint Inj: R hip joint  . XR C-ARM NO REPORT    Follow-up: Return for visit to requesting physician as needed.   Procedures: Large Joint Inj: R hip joint on 02/16/2020 3:58 PM Indications: pain and diagnostic evaluation Details: 22 G needle, anterior approach  Arthrogram: Yes  Medications: 4 mL bupivacaine 0.25 %; 60 mg triamcinolone acetonide 40 MG/ML Outcome: tolerated well, no immediate complications  Arthrogram demonstrated excellent flow of contrast throughout the joint surface without extravasation or obvious defect.  The patient had relief of symptoms during the anesthetic phase of the injection.  Procedure, treatment alternatives, risks and benefits explained, specific risks discussed. Consent was given by the patient. Immediately prior to procedure a time out was called to verify the correct patient, procedure, equipment, support staff and site/side marked as required. Patient was prepped and draped in the usual sterile fashion.      No notes on file   Clinical History: No specialty comments  available.     Objective:  VS:  HT:    WT:   BMI:     BP:   HR: bpm  TEMP: ( )  RESP:  Physical Exam Vitals and nursing note reviewed.  Constitutional:      General: She is not in acute distress.    Appearance: Normal appearance. She is well-developed. She is obese. She is not ill-appearing.  HENT:     Head: Normocephalic and atraumatic.  Eyes:     Conjunctiva/sclera: Conjunctivae normal.     Pupils: Pupils are equal, round, and reactive to light.  Cardiovascular:     Rate and Rhythm: Normal rate.     Pulses: Normal pulses.  Pulmonary:     Effort: Pulmonary effort is normal.  Musculoskeletal:     Right lower leg: No edema.     Left lower leg: No edema.     Comments: Right hip with standing and rotation. Good distal strength.  Skin:    General: Skin is warm and dry.     Findings: No erythema or rash.  Neurological:     General: No focal deficit present.     Mental Status: She is alert and oriented to person, place, and time.     Sensory: No sensory deficit.     Motor: No abnormal muscle tone.     Coordination: Coordination normal.     Gait: Gait normal.  Psychiatric:        Mood and Affect: Mood normal.        Behavior: Behavior normal.      Imaging: No results found.

## 2020-02-17 MED ORDER — TRIAMCINOLONE ACETONIDE 40 MG/ML IJ SUSP
60.0000 mg | INTRAMUSCULAR | Status: AC | PRN
  Administered 2020-02-16: 60 mg via INTRA_ARTICULAR

## 2020-02-17 MED ORDER — BUPIVACAINE HCL 0.25 % IJ SOLN
4.0000 mL | INTRAMUSCULAR | Status: AC | PRN
  Administered 2020-02-16: 4 mL via INTRA_ARTICULAR

## 2020-02-18 ENCOUNTER — Encounter: Payer: Self-pay | Admitting: Orthopedic Surgery

## 2020-02-18 NOTE — Progress Notes (Signed)
Office Visit Note   Patient: Crystal Wall           Date of Birth: 10-Oct-1965           MRN: [id number] Visit Date: 02/15/2020 Requested by: Jilda Panda, MD [id number] [place],  [place] [address] PCP: Jilda Panda, MD  Subjective: Chief Complaint  Patient presents with  . Right Leg - Pain    HPI: Crystal Wall is a patient with right hip and right knee pain.'s been going on for 2 years but worse over the past 2 months.  Denies a history of injury.  She complains of a feeling of a cramp like a pulled muscle.  She has good and bad days.  She reports difficulty with ambulation along with groin pain and thigh pain.  Denies any low back pain or numbness and tingling.  The pain does not wake her from sleep at night.  At times the leg feels very heavy.  She feels a pressure in her leg.  Laboratory values per patient report were normal.  Takes occasional Tylenol and ibuprofen.  The longer she walks the less pain she has.  Denies any mechanical symptoms in the hip.              ROS: All systems reviewed are negative as they relate to the chief complaint within the history of present illness.  Patient denies  fevers or chills.   Assessment & Plan: Visit Diagnoses:  1. Pain in right leg     Plan: Impression is Trendelenburg type gait with groin pain and some diminished range of motion with manipulation of the right leg.  Plan is diagnostic and therapeutic right hip injection.  Her knee actually looks pretty reasonable with no effusion and no arthritis on plain radiographs.  I really think that the groin and thigh pain this is coming from her hip.  No back symptoms.  We will see how she does with the injection.  Follow-Up Instructions: No follow-ups on file.   Orders:  Orders Placed This Encounter  Procedures  . XR HIP UNILAT W OR W/O PELVIS 2-3 VIEWS RIGHT  . XR Knee 1-2 Views Right  . Ambulatory referral to Physical Medicine Rehab   No orders of the defined types were placed in this  encounter.     Procedures: No procedures performed   Clinical Data: No additional findings.  Objective: Vital Signs: Ht 5\' 2"  (1.575 m)   Wt 284 lb (128.8 kg)   LMP 09/05/2014   BMI 51.94 kg/m   Physical Exam:   Constitutional: Patient appears well-developed HEENT:  Head: Normocephalic Eyes:EOM are normal Neck: Normal range of motion Cardiovascular: Normal rate Pulmonary/chest: Effort normal Neurologic: Patient is alert Skin: Skin is warm Psychiatric: Patient has normal mood and affect    Ortho Exam: Ortho exam demonstrates Trendelenburg gait to the right.  A little bit of pain with internal/external rotation on the right with no symptoms on the left.  Hip flexion abduction adduction strength 5+ out of 5 bilateral no right knee effusion with good range of motion and stable collateral and cruciate ligaments.  No other masses lymphadenopathy or skin changes noted in that right hip and knee region.  Pedal pulses palpable.  Ankle dorsiflexion plantarflexion intact.  No paresthesias L1 S1 right leg.  Specialty Comments:  No specialty comments available.  Imaging: No results found.   PMFS History: Patient Active Problem List   Diagnosis Date Noted  . Cecal polyp   .  Iron deficiency anemia 06/04/2018  . Family history of malignant neoplasm of gastrointestinal tract 10/24/2013  . Benign neoplasm of colon 10/24/2013  . OBESITY 07/05/2008  . GERD 07/05/2008  . Rectal bleeding 07/05/2008  . FECAL INCONTINENCE 07/05/2008  . ESOPHAGEAL MOTILITY DISORDER 06/29/2008  . HIATAL HERNIA 06/29/2008   Past Medical History:  Diagnosis Date  . Allergic rhinitis   . Allergy   . B12 deficiency   . GERD (gastroesophageal reflux disease)   . Hemorrhoids   . Hiatal hernia    as noted on barium swallow  . History of gestational diabetes   . Hyperplastic colon polyp   . Hypothyroidism   . Microcytic anemia   . Obesity   . Thyroid disease    hypothyroid    Family History    Problem Relation Age of Onset  . Heart disease Father        CHF  . Hypertension Father   . Prostate cancer Father   . Congestive Heart Failure Father   . Colon polyps Father   . Diabetes Sister   . Hypertension Sister   . Colon cancer Sister   . Uterine cancer Sister   . Hypertension Mother   . Colon polyps Mother   . Diabetes Maternal Grandmother   . Esophageal cancer Neg Hx   . Rectal cancer Neg Hx   . Stomach cancer Neg Hx     Past Surgical History:  Procedure Laterality Date  . BIOPSY  09/27/2019   Procedure: BIOPSY;  Surgeon: Doran Stabler, MD;  Location: [facility];  Service: Gastroenterology;;  . CESAREAN SECTION  2001  . COLONOSCOPY N/A 10/24/2013   Procedure: COLONOSCOPY;  Surgeon: Inda Castle, MD;  Location: [facility];  Service: Endoscopy;  Laterality: N/A;  . COLONOSCOPY WITH PROPOFOL N/A 08/10/2018   Procedure: COLONOSCOPY WITH PROPOFOL;  Surgeon: Doran Stabler, MD;  Location: [facility];  Service: Gastroenterology;  Laterality: N/A;  . COLONOSCOPY WITH PROPOFOL N/A 09/27/2019   Procedure: COLONOSCOPY WITH PROPOFOL;  Surgeon: Doran Stabler, MD;  Location: [facility];  Service: Gastroenterology;  Laterality: N/A;  . GASTRIC BYPASS  2007  . HYSTEROSCOPY  08/2010  . POLYPECTOMY  08/10/2018   Procedure: POLYPECTOMY;  Surgeon: Doran Stabler, MD;  Location: [facility];  Service: Gastroenterology;;  . POLYPECTOMY  09/27/2019   Procedure: POLYPECTOMY;  Surgeon: Doran Stabler, MD;  Location: [facility];  Service: Gastroenterology;;   Social History   Occupational History  . Not on file  Tobacco Use  . Smoking status: Never Smoker  . Smokeless tobacco: Never Used  Substance and Sexual Activity  . Alcohol use: Yes    Comment: occassionally  . Drug use: No  . Sexual activity: Yes    Birth control/protection: Other-see comments    Comment: vasectomy-1st intercourse 54 yo-More than 5 partners

## 2020-08-01 ENCOUNTER — Telehealth: Payer: Self-pay | Admitting: Gastroenterology

## 2020-08-01 NOTE — Telephone Encounter (Signed)
Darene Lamer has Dr. Loletha Carrow now.

## 2020-08-01 NOTE — Telephone Encounter (Signed)
Pt called to scheduled recall colon. Her last colonoscopy was at [facility] hospital due to BMI. Pt stated that her BMI has not changed. Pls call her to schedule procedure.

## 2020-08-02 NOTE — Telephone Encounter (Signed)
Left detailed message on patient's voicemail letting her know that Dr. Loletha Carrow currently has a wait list for hospital procedures. Advised that she will be added to the wait list and we will be in contact to schedule as soon as he has some availability. I also let her know that the Doctors only get a certain amount of hospital days. Advised patient to give me a call if she has any questions.

## 2020-09-06 ENCOUNTER — Other Ambulatory Visit: Payer: Self-pay

## 2020-09-06 ENCOUNTER — Ambulatory Visit (INDEPENDENT_AMBULATORY_CARE_PROVIDER_SITE_OTHER): Payer: 59 | Admitting: Obstetrics and Gynecology

## 2020-09-06 ENCOUNTER — Encounter: Payer: Self-pay | Admitting: Obstetrics and Gynecology

## 2020-09-06 VITALS — BP 128/80 | Ht 62.0 in | Wt 284.0 lb

## 2020-09-06 DIAGNOSIS — Z01419 Encounter for gynecological examination (general) (routine) without abnormal findings: Secondary | ICD-10-CM

## 2020-09-06 NOTE — Progress Notes (Signed)
   Crystal Wall 10/20/1965 [id number]  SUBJECTIVE:  54 y.o. G1P1 female for annual routine gynecologic exam. She has no gynecologic concerns.  Had lost significant weight through last year and maintaining at stable weight.  Current Outpatient Medications  Medication Sig Dispense Refill  . Calcium Carbonate (CALCIUM 500 PO) Take 500 mg by mouth 2 (two) times daily.     . cyanocobalamin (,VITAMIN B-12,) 1000 MCG/ML injection Inject 1,000 mcg into the muscle every 30 (thirty) days.     . Ferrous Sulfate (IRON) 325 (65 Fe) MG TABS Take 65 mg by mouth daily.    Marland Kitchen ibuprofen (ADVIL,MOTRIN) 200 MG tablet Take 400 mg by mouth every 6 (six) hours as needed for headache or moderate pain.     . Multiple Vitamin (MULTIVITAMIN) tablet Take 1 tablet by mouth daily.      Marland Kitchen omeprazole (PRILOSEC) 20 MG capsule Take 20 mg by mouth daily as needed (acid reflux).    . SYNTHROID 137 MCG tablet Take 137 mcg by mouth daily before breakfast.   6  . vitamin C (ASCORBIC ACID) 500 MG tablet Take 500 mg by mouth daily.     No current facility-administered medications for this visit.   Allergies: Patient has no known allergies.  Patient's last menstrual period was 09/05/2014.  Past medical history,surgical history, problem list, medications, allergies, family history and social history were all reviewed and documented as reviewed in the EPIC chart.  ROS: Pertinent positives and negatives as reviewed in HPI    OBJECTIVE:  BP 128/80 (BP Location: Right Arm, Patient Position: Sitting, Cuff Size: Large)   Ht 5\' 2"  (1.575 m)   Wt 284 lb (128.8 kg)   LMP 09/05/2014   BMI 51.94 kg/m  The patient appears well, alert, oriented, in no distress. ENT normal.  Neck supple. No cervical or supraclavicular adenopathy or thyromegaly.  Lungs are clear, good air entry, no wheezes, rhonchi or rales. S1 and S2 normal, no murmurs, regular rate and rhythm.  Abdomen soft without tenderness, guarding, mass or organomegaly.    Neurological is normal, no focal findings.  BREAST EXAM: breasts appear normal, no suspicious masses, no skin or nipple changes or axillary nodes  PELVIC EXAM: VULVA: normal appearing vulva with no masses, tenderness or lesions, VAGINA: normal appearing vagina with normal color and discharge, no lesions, CERVIX: normal appearing cervix without discharge or lesions, UTERUS: Unable to clearly delineate and palpate but no notable tenderness or mass, ADNEXA: Limited examination but overall adnexa palpate in size, nontender and no masses  Chaperone: Wandra Scot Bonham present during the examination  ASSESSMENT:  54 y.o. G1P1 here for annual gynecologic exam  PLAN:   1. Postmenopausal.  No significant hot flashes or night sweats.  No vaginal bleeding. 2. Pap smear/HPV 2020.  No significant history of abnormal Pap smears.  Next Pap smear due 2025 following the current guidelines recommending the 5 year interval. 3. Mammogram 08/2019.  Normal breast exam today.  She said she has her annual mammogram scheduled for tomorrow. 4. Colonoscopy 2019.  She has had some polyps removed and is followed at a close interval per gastroenterology.  5. DEXA never. Would recommend when further into menopause. 6. Health maintenance.  No labs today as she normally has these completed with elsewhere. BP better this year.  Congratulated on her weight loss efforts and success.  Return annually or sooner, prn.  Joseph Pierini MD 09/06/20

## 2020-10-16 NOTE — Telephone Encounter (Signed)
Inbound call from patient asking if procedure can be scheduled for May this year.

## 2020-10-16 NOTE — Telephone Encounter (Signed)
Spoke with patient, advised that we do not have the May hospital schedule at this time. Patient states that it would be hard to do with work right now. Advised that she is on the hospital wait list, advised her to call back in April to see if we have gotten the May hospital schedule at that time. Patient verbalized understanding and had no concerns at the end of the call.

## 2021-01-07 ENCOUNTER — Other Ambulatory Visit: Payer: Self-pay

## 2021-01-07 ENCOUNTER — Telehealth: Payer: Self-pay

## 2021-01-07 DIAGNOSIS — Z800 Family history of malignant neoplasm of digestive organs: Secondary | ICD-10-CM

## 2021-01-07 DIAGNOSIS — Z86010 Personal history of colonic polyps: Secondary | ICD-10-CM

## 2021-01-07 NOTE — Telephone Encounter (Signed)
Spoke with patient to schedule her recall hospital colonoscopy. Patient is scheduled for a pre-visit on Thursday, 01/10/21 at 8 AM, she is aware that she will need to check in on the 3rd floor this day. COVID test is scheduled for Friday, 02/01/21 at 8:20 AM. Colonoscopy at [facility] on Tuesday, 02/05/21 at 10:15 AM, pt will need to arrive at 8:45 AM with a care partner. Patient verbalized understanding of all information and had no concerns at the end of the call.   CASE ID: [id number]

## 2021-01-07 NOTE — Telephone Encounter (Signed)
-----   Message from Yevette Edwards, RN sent at 11/26/2020 10:35 AM EST ----- Regarding: Recall Colon Recall hospital colonoscopy - can direct book per Dr. Loletha Carrow.  Patient would like this scheduled May 2022.

## 2021-01-10 ENCOUNTER — Ambulatory Visit (AMBULATORY_SURGERY_CENTER): Payer: Self-pay

## 2021-01-10 ENCOUNTER — Other Ambulatory Visit: Payer: Self-pay

## 2021-01-10 VITALS — Ht 62.0 in | Wt 290.0 lb

## 2021-01-10 DIAGNOSIS — Z86010 Personal history of colonic polyps: Secondary | ICD-10-CM

## 2021-01-10 MED ORDER — PLENVU 140 G PO SOLR: 1.0000 | ORAL | 0 refills | Status: DC

## 2021-01-10 NOTE — Progress Notes (Signed)
No egg or soy allergy known to patient  No issues with past sedation with any surgeries or procedures Patient denies ever being told they had issues or difficulty with intubation  No FH of Malignant Hyperthermia No diet pills per patient No home 02 use per patient  No blood thinners per patient  Pt denies issues with constipation  No A fib or A flutter  EMMI video via MyChart  COVID 19 guidelines implemented in [place] today with Pt and RN  COVID screening scheduled on 02/01/2021 at 8:20 am; patient is aware of appt date/time;  Due to the COVID-19 pandemic we are asking patients to follow certain guidelines.  Pt aware of COVID protocols and [facility] guidelines  Plenvu sample given to patient today at pre visit LOT#[id number] EXP:06/2021

## 2021-02-01 ENCOUNTER — Other Ambulatory Visit: Payer: Self-pay

## 2021-02-01 ENCOUNTER — Other Ambulatory Visit (HOSPITAL_COMMUNITY)
Admission: RE | Admit: 2021-02-01 | Discharge: 2021-02-01 | Disposition: A | Discharge status: Home / Self Care | Payer: 59 | Source: Ambulatory Visit | Attending: Gastroenterology | Admitting: Gastroenterology

## 2021-02-01 DIAGNOSIS — Z01812 Encounter for preprocedural laboratory examination: Secondary | ICD-10-CM | POA: Insufficient documentation

## 2021-02-01 DIAGNOSIS — Z20822 Contact with and (suspected) exposure to covid-19: Secondary | ICD-10-CM | POA: Insufficient documentation

## 2021-02-01 LAB — SARS CORONAVIRUS 2 (TAT 6-24 HRS): SARS Coronavirus 2: NEGATIVE

## 2021-02-05 ENCOUNTER — Ambulatory Visit (HOSPITAL_COMMUNITY)
Admission: RE | Admit: 2021-02-05 | Discharge: 2021-02-05 | Disposition: A | Discharge status: Home / Self Care | Payer: 59 | Attending: Gastroenterology | Admitting: Gastroenterology

## 2021-02-05 ENCOUNTER — Ambulatory Visit (HOSPITAL_COMMUNITY): Payer: 59 | Admitting: Anesthesiology

## 2021-02-05 ENCOUNTER — Other Ambulatory Visit: Payer: Self-pay

## 2021-02-05 ENCOUNTER — Encounter (HOSPITAL_COMMUNITY): Payer: Self-pay | Admitting: Gastroenterology

## 2021-02-05 ENCOUNTER — Encounter (HOSPITAL_COMMUNITY)
Admission: RE | Disposition: A | Discharge status: Home / Self Care | Payer: Self-pay | Source: Home / Self Care | Attending: Gastroenterology

## 2021-02-05 DIAGNOSIS — D122 Benign neoplasm of ascending colon: Secondary | ICD-10-CM | POA: Insufficient documentation

## 2021-02-05 DIAGNOSIS — Z1211 Encounter for screening for malignant neoplasm of colon: Secondary | ICD-10-CM | POA: Diagnosis not present

## 2021-02-05 DIAGNOSIS — D120 Benign neoplasm of cecum: Secondary | ICD-10-CM | POA: Insufficient documentation

## 2021-02-05 DIAGNOSIS — Z9884 Bariatric surgery status: Secondary | ICD-10-CM | POA: Insufficient documentation

## 2021-02-05 DIAGNOSIS — Z860100 Personal history of colon polyps, unspecified: Secondary | ICD-10-CM

## 2021-02-05 HISTORY — PX: COLONOSCOPY WITH PROPOFOL: SHX5780

## 2021-02-05 HISTORY — PX: POLYPECTOMY: SHX5525

## 2021-02-05 SURGERY — COLONOSCOPY WITH PROPOFOL
Anesthesia: Monitor Anesthesia Care

## 2021-02-05 MED ORDER — PROPOFOL 10 MG/ML IV BOLUS
INTRAVENOUS | Status: DC | PRN
  Administered 2021-02-05: 50 mg via INTRAVENOUS

## 2021-02-05 MED ORDER — LACTATED RINGERS IV SOLN: INTRAVENOUS | Status: DC

## 2021-02-05 MED ORDER — GLYCOPYRROLATE PF 0.2 MG/ML IJ SOSY
PREFILLED_SYRINGE | INTRAMUSCULAR | Status: DC | PRN
  Administered 2021-02-05: .2 mg via INTRAVENOUS

## 2021-02-05 MED ORDER — PROPOFOL 500 MG/50ML IV EMUL
INTRAVENOUS | Status: DC | PRN
  Administered 2021-02-05: 150 ug/kg/min via INTRAVENOUS

## 2021-02-05 SURGICAL SUPPLY — 21 items

## 2021-02-05 NOTE — Anesthesia Postprocedure Evaluation (Signed)
Anesthesia Post Note  Patient: ORLEAN HOLTROP  Procedure(s) Performed: COLONOSCOPY WITH PROPOFOL (N/A ) POLYPECTOMY     Patient location during evaluation: Endoscopy Anesthesia Type: MAC Level of consciousness: awake and alert Pain management: pain level controlled Vital Signs Assessment: post-procedure vital signs reviewed and stable Respiratory status: spontaneous breathing, nonlabored ventilation, respiratory function stable and patient connected to nasal cannula oxygen Cardiovascular status: blood pressure returned to baseline and stable Postop Assessment: no apparent nausea or vomiting Anesthetic complications: no   No complications documented.  Last Vitals:  Vitals:   02/05/21 1030 02/05/21 1036  BP: (!) 161/79   Pulse: 66 68  Resp: (!) 21 14  Temp:    SpO2: 98% 100%    Last Pain:  Vitals:   02/05/21 1036  TempSrc:   PainSc: 0-No pain                 Karem Tomaso L Grete Bosko

## 2021-02-05 NOTE — Transfer of Care (Signed)
Immediate Anesthesia Transfer of Care Note  Patient: Crystal Wall  Procedure(s) Performed: COLONOSCOPY WITH PROPOFOL (N/A ) POLYPECTOMY  Patient Location: PACU and Endoscopy Unit  Anesthesia Type:MAC  Level of Consciousness: awake, alert  and oriented  Airway & Oxygen Therapy: Patient Spontanous Breathing and Patient connected to face mask  Post-op Assessment: Report given to RN and Post -op Vital signs reviewed and stable  Post vital signs: Reviewed and stable  Last Vitals:  Vitals Value Taken Time  BP 132/108 02/05/21 1010  Temp 36.6 C 02/05/21 1006  Pulse 66 02/05/21 1014  Resp 25 02/05/21 1014  SpO2 98 % 02/05/21 1014  Vitals shown include unvalidated device data.  Last Pain:  Vitals:   02/05/21 1006  TempSrc:   PainSc: 0-No pain         Complications: No complications documented.

## 2021-02-05 NOTE — Discharge Instructions (Signed)

## 2021-02-05 NOTE — Anesthesia Preprocedure Evaluation (Addendum)
Anesthesia Evaluation  Patient identified by MRN, date of birth, ID band Patient awake    Reviewed: Allergy & Precautions, NPO status , Patient's Chart, lab work & pertinent test results  Airway Mallampati: I  TM Distance: >3 FB Neck ROM: Full    Dental  (+) Chipped, Dental Advisory Given,    Pulmonary neg pulmonary ROS,    Pulmonary exam normal breath sounds clear to auscultation       Cardiovascular negative cardio ROS Normal cardiovascular exam Rhythm:Regular Rate:Normal     Neuro/Psych negative neurological ROS  negative psych ROS   GI/Hepatic Neg liver ROS, hiatal hernia, GERD  Medicated and Controlled,  Endo/Other  Hypothyroidism Morbid obesity (BMI 52)  Renal/GU negative Renal ROS  negative genitourinary   Musculoskeletal negative musculoskeletal ROS (+)   Abdominal   Peds  Hematology negative hematology ROS (+)   Anesthesia Other Findings Colonoscopy for h/o polyps  Reproductive/Obstetrics                            Anesthesia Physical Anesthesia Plan  ASA: III  Anesthesia Plan: MAC   Post-op Pain Management:    Induction: Intravenous  PONV Risk Score and Plan: 2 and Propofol infusion and Treatment may vary due to age or medical condition  Airway Management Planned: Natural Airway  Additional Equipment:   Intra-op Plan:   Post-operative Plan:   Informed Consent: I have reviewed the patients History and Physical, chart, labs and discussed the procedure including the risks, benefits and alternatives for the proposed anesthesia with the patient or authorized representative who has indicated his/her understanding and acceptance.     Dental advisory given  Plan Discussed with: CRNA  Anesthesia Plan Comments:         Anesthesia Quick Evaluation

## 2021-02-05 NOTE — Interval H&P Note (Signed)
History and Physical Interval Note:  02/05/2021 9:26 AM  Crystal Wall  has presented today for surgery, with the diagnosis of personal history of colon polyps.  The various methods of treatment have been discussed with the patient and family. After consideration of risks, benefits and other options for treatment, the patient has consented to  Procedure(s): COLONOSCOPY WITH PROPOFOL (N/A) as a surgical intervention.  The patient's history has been reviewed, patient examined, no change in status, stable for surgery.  I have reviewed the patient's chart and labs.  Questions were answered to the patient's satisfaction.     Nelida Meuse III

## 2021-02-05 NOTE — H&P (Signed)
History:  This patient presents for endoscopic testing for Hx colon polyps.  Crystal Wall Referring physician: Jilda Panda, MD  Past Medical History: Past Medical History:  Diagnosis Date  . Allergic rhinitis   . Allergy    seasonal allergies  . B12 deficiency   . GERD (gastroesophageal reflux disease)    on meds  . Hemorrhoids   . Hiatal hernia    as noted on barium swallow  . History of gestational diabetes   . Hyperplastic colon polyp   . Hypothyroidism    on meds  . Microcytic anemia    on iron medication   . Obesity   . Thyroid disease    hypothyroid     Past Surgical History: Past Surgical History:  Procedure Laterality Date  . BIOPSY  09/27/2019   Procedure: BIOPSY;  Surgeon: Doran Stabler, MD;  Location: [facility];  Service: Gastroenterology;;  . CESAREAN SECTION  2001  . COLONOSCOPY N/A 10/24/2013   Procedure: COLONOSCOPY;  Surgeon: Inda Castle, MD;  Location: [facility];  Service: Endoscopy;  Laterality: N/A;  . COLONOSCOPY  2020   at [facility])  . COLONOSCOPY WITH PROPOFOL N/A 08/10/2018   Procedure: COLONOSCOPY WITH PROPOFOL;  Surgeon: Doran Stabler, MD;  Location: [facility];  Service: Gastroenterology;  Laterality: N/A;  . COLONOSCOPY WITH PROPOFOL N/A 09/27/2019   Procedure: COLONOSCOPY WITH PROPOFOL;  Surgeon: Doran Stabler, MD;  Location: [facility];  Service: Gastroenterology;  Laterality: N/A;  . GASTRIC BYPASS  2007  . HYSTEROSCOPY  08/2010  . POLYPECTOMY  08/10/2018   Procedure: POLYPECTOMY;  Surgeon: Doran Stabler, MD;  Location: [facility];  Service: Gastroenterology;;  . POLYPECTOMY  09/27/2019   Procedure: POLYPECTOMY;  Surgeon: Doran Stabler, MD;  Location: [facility];  Service: Gastroenterology;;    Allergies: No Known Allergies  Outpatient Meds: Current Facility-Administered Medications  Medication Dose Route Frequency Provider Last Rate Last Admin  . lactated ringers infusion    Intravenous Continuous Nelida Meuse III, MD 10 mL/hr at 02/05/21 0901 New Bag at 02/05/21 0901      ___________________________________________________________________ Objective   Exam:  BP (!) 145/49   Temp 98.8 F (37.1 C) (Oral)   Resp (!) 22   Ht _0  (1.575 m)   Wt 129.3 kg   LMP 09/05/2014   SpO2 95%   BMI 52.13 kg/m    CV: RRR without murmur, S1/S2, no JVD, no peripheral edema  Resp: clear to auscultation bilaterally, normal RR and effort noted  GI: soft, no tenderness, with active bowel sounds. Exam for mass or HSM limited by body habitus  Neuro: awake, alert and oriented x 3. Normal gross motor function and fluent speech   Assessment:  Hx colon polyps (SSP in 2019, 2020)  Plan:  Colonoscopy  The benefits and risks of the planned procedure were described in detail with the patient or (when appropriate) their health care proxy.  Risks were outlined as including, but not limited to, bleeding, infection, perforation, adverse medication reaction leading to cardiac or pulmonary decompensation, pancreatitis (if ERCP).  The limitation of incomplete mucosal visualization was also discussed.  No guarantees or warranties were given.    Nelida Meuse III

## 2021-02-05 NOTE — Op Note (Signed)
[facility] Patient Name: Crystal Wall Procedure Date: 02/05/2021 MRN: [id number] Attending MD: Estill Cotta. Crystal Wall , MD Date of Birth: 1966-10-03 CSN: [id number] Age: 55 Admit Type: Outpatient Procedure:                Colonoscopy Indications:              Increased risk colon cancer surveillance: Personal                            history of sessile serrated colon polyp (10 mm or                            greater in size) - 08/2018 - 54m cecal SSP; 12/20                            - nodular cecal mucosa without neoplasia on Bx.                            add'l 85mand 1540mscending colon SSPs on that exam Providers:                Crystal Wall, Crystal Wall, Crystal Wall                         Technician Referring MD:             Crystal Wall:                Monitored Anesthesia Care Complications:            No immediate complications. Estimated Blood Loss:     Estimated blood loss was minimal. Procedure:                Pre-Anesthesia Assessment:                           - Prior to the procedure, a History and Physical                            was performed, and patient medications and                            allergies were reviewed. The patient's tolerance of                            previous anesthesia was also reviewed. The risks                            and benefits of the procedure and the sedation                            options and risks were discussed with the patient.                            All questions were answered, and informed consent  was obtained. Prior Anticoagulants: The patient has                            taken no previous anticoagulant or antiplatelet                            agents. ASA Grade Assessment: III - A patient with                            severe systemic disease. After reviewing the risks                            and benefits, the patient was deemed in                             satisfactory condition to undergo the procedure.                           After obtaining informed consent, the colonoscope                            was passed under direct vision. Throughout the                            procedure, the patient's blood pressure, pulse, and                            oxygen saturations were monitored continuously. The                            CF-HQ190L ([id number]) Olympus colonoscope was                            introduced through the anus and advanced to the the                            cecum, identified by appendiceal orifice and                            ileocecal valve. The colonoscopy was performed                            without difficulty. The patient tolerated the                            procedure well. The quality of the bowel                            preparation was excellent. The ileocecal valve,                            appendiceal orifice, and rectum were photographed. Scope In: 9:43:00 AM Scope Out: 10:01:25 AM Scope Withdrawal Time: 0 hours 14 minutes 57 seconds  Total Procedure Duration: 0  hours 18 minutes 25 seconds  Findings:      The perianal and digital rectal examinations were normal.      Two sessile polyps were found in the cecum. The polyps were diminutive       in size. These polyps were removed with a cold snare. Resection and       retrieval were complete.      A 8 mm polyp was found in the proximal ascending colon/distal aspect of       the ICV. The polyp was semi-sessile. The polyp was removed with a cold       snare. Resection and retrieval were complete.      The exam was otherwise without abnormality on direct and retroflexion       views. Impression:               - Two diminutive polyps in the cecum, removed with                            a cold snare. Resected and retrieved.                           - One 8 mm polyp in the proximal ascending colon,                             removed with a cold snare. Resected and retrieved.                           - The examination was otherwise normal on direct                            and retroflexion views. Moderate Sedation:      MAC sedation used Recommendation:           - Patient has a contact number available for                            emergencies. The signs and symptoms of potential                            delayed complications were discussed with the                            patient. Return to normal activities tomorrow.                            Written discharge instructions were provided to the                            patient.                           - Resume previous diet.                           - Continue present medications.                           -  Await pathology results.                           - Repeat colonoscopy is recommended for                            surveillance. The colonoscopy date will be                            determined after pathology results from today's                            exam become available for review. Procedure Code(s):        --- Professional ---                           [phone], Colonoscopy, flexible; with removal of                            tumor(s), polyp(s), or other lesion(s) by snare                            technique Diagnosis Code(s):        --- Professional ---                           K63.5, Polyp of colon                           Z86.010, Personal history of colonic polyps CPT copyright 2019 [employer]. All rights reserved. The codes documented in this report are preliminary and upon coder review may  be revised to meet current compliance requirements. Crystal Wall L. Loletha Carrow, MD 02/05/2021 10:07:52 AM This report has been signed electronically. Number of Addenda: 0

## 2021-02-06 ENCOUNTER — Encounter (HOSPITAL_COMMUNITY): Payer: Self-pay | Admitting: Gastroenterology

## 2021-02-06 LAB — SURGICAL PATHOLOGY

## 2021-02-11 ENCOUNTER — Encounter: Payer: Self-pay | Admitting: Gastroenterology

## 2021-07-02 ENCOUNTER — Other Ambulatory Visit: Payer: Self-pay

## 2021-07-02 ENCOUNTER — Ambulatory Visit
Admission: RE | Admit: 2021-07-02 | Discharge: 2021-07-02 | Disposition: A | Discharge status: Home / Self Care | Payer: 59 | Source: Ambulatory Visit | Attending: Internal Medicine | Admitting: Internal Medicine

## 2021-07-02 ENCOUNTER — Other Ambulatory Visit: Payer: Self-pay | Admitting: Internal Medicine

## 2021-07-02 DIAGNOSIS — R52 Pain, unspecified: Secondary | ICD-10-CM

## 2021-08-19 ENCOUNTER — Other Ambulatory Visit: Payer: Self-pay | Admitting: Internal Medicine

## 2021-08-19 DIAGNOSIS — R55 Syncope and collapse: Secondary | ICD-10-CM

## 2021-09-02 ENCOUNTER — Telehealth: Payer: Self-pay | Admitting: Physical Medicine and Rehabilitation

## 2021-09-02 NOTE — Telephone Encounter (Signed)
Pt called stating she got a hip injection 02/2020 and was told if the pain came back to call and she could get another injection by Dr.Newton. Pt would like a CB to set this up please.   [phone]

## 2021-09-06 ENCOUNTER — Ambulatory Visit: Payer: 59 | Admitting: Orthopedic Surgery

## 2021-09-09 ENCOUNTER — Ambulatory Visit (INDEPENDENT_AMBULATORY_CARE_PROVIDER_SITE_OTHER): Payer: 59 | Admitting: Orthopedic Surgery

## 2021-09-09 ENCOUNTER — Encounter: Payer: Self-pay | Admitting: Orthopedic Surgery

## 2021-09-09 ENCOUNTER — Other Ambulatory Visit: Payer: Self-pay

## 2021-09-09 DIAGNOSIS — G8929 Other chronic pain: Secondary | ICD-10-CM | POA: Diagnosis not present

## 2021-09-09 DIAGNOSIS — M25561 Pain in right knee: Secondary | ICD-10-CM | POA: Diagnosis not present

## 2021-09-09 NOTE — Progress Notes (Addendum)
Office Visit Note   Patient: Crystal Wall           Date of Birth: 02/24/1966           MRN: [id number] Visit Date: 09/09/2021 Requested by: Jilda Panda, MD [id number] [place],   [id number] PCP: Jilda Panda, MD  Subjective: Chief Complaint  Patient presents with   Right Knee - Pain   HPI: Crystal Wall is a 55 year old patient who fell injuring her right knee in September of this year.  Reports numbness on the anterior aspect of the knee.  Direct impact injury.  Never really hurt her so much but now she is concerned about numbness anteriorly around her knee.  Denies any groin pain.  Her right hip injection did help her and she has another one pending with Dr. Ernestina Patches              ROS: All systems reviewed are negative as they relate to the chief complaint within the history of present illness.  Patient denies  fevers or chills.   Assessment & Plan: Visit Diagnoses: No diagnosis found.  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  Plan is observation.  I think this may take several months for that bruised sensory saphenous nerve branch to improve but there should be no long-term consequences.  Review of radiographs taken at the time showed no acute fracture.  There is some atypical spurring around the patella which is not really palpable on physical examination today follow-up as needed  Follow-Up Instructions: No follow-ups on file.   Orders:  No orders of the defined types were placed in this encounter.  No orders of the defined types were placed in this encounter.     Procedures: No procedures performed   Clinical Data: No additional findings.  Objective: Vital Signs: LMP 09/05/2014   Physical Exam:   Constitutional: Patient appears well-developed HEENT:  Head: Normocephalic Eyes:EOM are normal Neck: Normal range of motion Cardiovascular: Normal rate Pulmonary/chest: Effort normal Neurologic: Patient is alert Skin:  Skin is warm Psychiatric: Patient has normal mood and affect   Ortho Exam: Ortho exam demonstrates intact extensor mechanism right knee with no effusion.  Does have paresthesias anterior aspect of the knee on the right compared to the left.  No groin pain with internal or external rotation of either leg.  No paresthesias in the calf region on the medial or lateral side bilaterally.  Pedal pulses palpable.  Specialty Comments:  No specialty comments available.  Imaging: No results found.   PMFS History: Patient Active Problem List   Diagnosis Date Noted   Personal history of colonic polyps    Benign neoplasm of cecum    Cecal polyp    Iron deficiency anemia 06/04/2018   Family history of malignant neoplasm of gastrointestinal tract 10/24/2013   Benign neoplasm of colon 10/24/2013   OBESITY 07/05/2008   GERD 07/05/2008   Rectal bleeding 07/05/2008   FECAL INCONTINENCE 07/05/2008   ESOPHAGEAL MOTILITY DISORDER 06/29/2008   HIATAL HERNIA 06/29/2008   Past Medical History:  Diagnosis Date   Allergic rhinitis    Allergy    seasonal allergies   B12 deficiency    GERD (gastroesophageal reflux disease)    on meds   Hemorrhoids    Hiatal hernia    as noted on barium swallow   History of gestational diabetes    Hyperplastic colon polyp    Hypothyroidism    on meds  Microcytic anemia    on iron medication    Obesity    Thyroid disease    hypothyroid    Family History  Problem Relation Age of Onset   Heart disease Father        CHF   Hypertension Father    Prostate cancer Father    Congestive Heart Failure Father    Colon polyps Father    Diabetes Sister    Hypertension Sister    Colon cancer Sister 55   Uterine cancer Sister    Colon polyps Sister 72   Hypertension Mother    Colon polyps Mother    Diabetes Maternal Grandmother    Esophageal cancer Neg Hx    Rectal cancer Neg Hx    Stomach cancer Neg Hx     Past Surgical History:  Procedure Laterality Date    BIOPSY  09/27/2019   Procedure: BIOPSY;  Surgeon: Doran Stabler, MD;  Location: [facility];  Service: Gastroenterology;;   CESAREAN SECTION  2001   COLONOSCOPY N/A 10/24/2013   Procedure: COLONOSCOPY;  Surgeon: Inda Castle, MD;  Location: [facility];  Service: Endoscopy;  Laterality: N/A;   COLONOSCOPY  2020   at [facility])   COLONOSCOPY WITH PROPOFOL N/A 08/10/2018   Procedure: COLONOSCOPY WITH PROPOFOL;  Surgeon: Doran Stabler, MD;  Location: [facility];  Service: Gastroenterology;  Laterality: N/A;   COLONOSCOPY WITH PROPOFOL N/A 09/27/2019   Procedure: COLONOSCOPY WITH PROPOFOL;  Surgeon: Doran Stabler, MD;  Location: [facility];  Service: Gastroenterology;  Laterality: N/A;   COLONOSCOPY WITH PROPOFOL N/A 02/05/2021   Procedure: COLONOSCOPY WITH PROPOFOL;  Surgeon: Doran Stabler, MD;  Location: [facility];  Service: Gastroenterology;  Laterality: N/A;   GASTRIC BYPASS  2007   HYSTEROSCOPY  08/2010   POLYPECTOMY  08/10/2018   Procedure: POLYPECTOMY;  Surgeon: Doran Stabler, MD;  Location: [facility];  Service: Gastroenterology;;   POLYPECTOMY  09/27/2019   Procedure: POLYPECTOMY;  Surgeon: Doran Stabler, MD;  Location: [facility];  Service: Gastroenterology;;   POLYPECTOMY  02/05/2021   Procedure: POLYPECTOMY;  Surgeon: Doran Stabler, MD;  Location: [facility];  Service: Gastroenterology;;   Social History   Occupational History   Not on file  Tobacco Use   Smoking status: Never   Smokeless tobacco: Never  Vaping Use   Vaping Use: Never used  Substance and Sexual Activity   Alcohol use: Not Currently    Comment: occassionally   Drug use: No   Sexual activity: Yes    Birth control/protection: Other-see comments    Comment: vasectomy-1st intercourse 55 yo-More than 5 partners

## 2021-09-12 ENCOUNTER — Encounter: Payer: Self-pay | Admitting: Nurse Practitioner

## 2021-09-13 ENCOUNTER — Other Ambulatory Visit: Payer: Self-pay

## 2021-09-13 ENCOUNTER — Encounter: Payer: Self-pay | Admitting: Cardiology

## 2021-09-13 ENCOUNTER — Ambulatory Visit: Payer: 59 | Admitting: Cardiology

## 2021-09-13 ENCOUNTER — Encounter: Payer: Self-pay | Admitting: Internal Medicine

## 2021-09-13 VITALS — BP 127/84 | HR 73 | Temp 98.0°F | Resp 17 | Ht 62.0 in | Wt 300.0 lb

## 2021-09-13 DIAGNOSIS — R55 Syncope and collapse: Secondary | ICD-10-CM

## 2021-09-13 DIAGNOSIS — Z6841 Body Mass Index (BMI) 40.0 and over, adult: Secondary | ICD-10-CM

## 2021-09-13 NOTE — Progress Notes (Signed)
Primary Physician/Referring:  Jilda Panda, MD  Patient ID: Crystal Wall, female    DOB: [birth year], 55 y.o.   MRN: [id number]  Chief Complaint  Patient presents with   New Patient (Initial Visit)   Loss of Consciousness   HPI:    Crystal Wall  is a 55 y.o. Caucasian female with morbid obesity referred to me for evaluation of syncope.  She has no hypertension, diabetes, hyperlipidemia.  She has history of gastric bypass surgery about 15 years ago but unfortunately gained the weight back.  Patient has had 3 episodes of falls, starting in August when she was walking in the hallway suddenly fell.  She was with her daughter.  No history to suggest loss of consciousness.  Last episode happened while she was trying to get into the porch and fell.  No premonitory symptoms, no loss of bowel or bladder control, one episode was witnessed by her daughter but no mention of loss of consciousness.  Past Medical History:  Diagnosis Date   Allergic rhinitis    Allergy    seasonal allergies   B12 deficiency    GERD (gastroesophageal reflux disease)    on meds   Hemorrhoids    Hiatal hernia    as noted on barium swallow   History of gestational diabetes    Hyperplastic colon polyp    Hypothyroidism    on meds   Microcytic anemia    on iron medication    Obesity    Thyroid disease    hypothyroid   Past Surgical History:  Procedure Laterality Date   BIOPSY  09/27/2019   Procedure: BIOPSY;  Surgeon: Doran Stabler, MD;  Location: [facility];  Service: Gastroenterology;;   CESAREAN SECTION  2001   COLONOSCOPY N/A 10/24/2013   Procedure: COLONOSCOPY;  Surgeon: Inda Castle, MD;  Location: [facility];  Service: Endoscopy;  Laterality: N/A;   COLONOSCOPY  2020   at [facility])   COLONOSCOPY WITH PROPOFOL N/A 08/10/2018   Procedure: COLONOSCOPY WITH PROPOFOL;  Surgeon: Doran Stabler, MD;  Location: [facility];  Service: Gastroenterology;  Laterality: N/A;    COLONOSCOPY WITH PROPOFOL N/A 09/27/2019   Procedure: COLONOSCOPY WITH PROPOFOL;  Surgeon: Doran Stabler, MD;  Location: [facility];  Service: Gastroenterology;  Laterality: N/A;   COLONOSCOPY WITH PROPOFOL N/A 02/05/2021   Procedure: COLONOSCOPY WITH PROPOFOL;  Surgeon: Doran Stabler, MD;  Location: [facility];  Service: Gastroenterology;  Laterality: N/A;   GASTRIC BYPASS  2007   HYSTEROSCOPY  08/2010   POLYPECTOMY  08/10/2018   Procedure: POLYPECTOMY;  Surgeon: Doran Stabler, MD;  Location: [facility];  Service: Gastroenterology;;   POLYPECTOMY  09/27/2019   Procedure: POLYPECTOMY;  Surgeon: Doran Stabler, MD;  Location: [facility];  Service: Gastroenterology;;   POLYPECTOMY  02/05/2021   Procedure: POLYPECTOMY;  Surgeon: Doran Stabler, MD;  Location: [facility];  Service: Gastroenterology;;   Family History  Problem Relation Age of Onset   Hypertension Mother    Colon polyps Mother    Heart disease Father        CHF   Hypertension Father    Prostate cancer Father    Congestive Heart Failure Father    Colon polyps Father    Diabetes Sister    Hypertension Sister    Colon cancer Sister 77   Uterine cancer Sister    Colon polyps Sister 68   Diabetes Maternal Grandmother  Esophageal cancer Neg Hx    Rectal cancer Neg Hx    Stomach cancer Neg Hx     Social History   Tobacco Use   Smoking status: Never   Smokeless tobacco: Never  Substance Use Topics   Alcohol use: Not Currently    Comment: occassionally   Marital Status: Married  ROS  Review of Systems  Cardiovascular:  Negative for chest pain, dyspnea on exertion and leg swelling.  Gastrointestinal:  Negative for melena.  Objective  Blood pressure 127/84, pulse 73, temperature 98 F (36.7 C), resp. rate 17, height _0  (1.575 m), weight 300 lb (136.1 kg), last menstrual period 09/05/2014, SpO2 98 %. Body mass index is 54.87 kg/m.  Vitals with BMI 09/13/2021 02/05/2021 02/05/2021  Height  _1  - -  Weight 300 lbs - -  BMI 32.44 - -  Systolic 010 - 272  Diastolic 84 - 79  Pulse 73 68 66     Physical Exam Constitutional:      Appearance: She is morbidly obese.  Neck:     Vascular: No carotid bruit or JVD.  Cardiovascular:     Rate and Rhythm: Normal rate and regular rhythm.     Pulses:          Carotid pulses are 2+ on the right side and 2+ on the left side.      Dorsalis pedis pulses are 2+ on the right side and 2+ on the left side.       Posterior tibial pulses are 2+ on the right side and 2+ on the left side.     Heart sounds: Normal heart sounds. No murmur heard.   No gallop.     Comments: Femoral and popliteal pulse difficult to feel due to patient's body habitus.  Pulmonary:     Effort: Pulmonary effort is normal.     Breath sounds: Normal breath sounds.  Abdominal:     General: Bowel sounds are normal.     Palpations: Abdomen is soft.     Comments: Obese. Pannus present  Musculoskeletal:     Right lower leg: No edema.     Left lower leg: No edema.     Laboratory examination:   No results for input(s): NA, K, CL, CO2, GLUCOSE, BUN, CREATININE, CALCIUM, GFRNONAA, GFRAA in the last 8760 hours. CrCl cannot be calculated (No successful lab value found.).  No flowsheet data found. CBC Latest Ref Rng & Units 10/21/2019 04/26/2019 11/26/2018  WBC 4.0 - 10.5 K/uL 5.0 4.8 6.0  Hemoglobin 12.0 - 15.0 g/dL 13.5 13.8 13.5  Hematocrit 36.0 - 46.0 % 40.2 40.7 41.0  Platelets 150 - 400 K/uL 219 194 207    Lipid Panel No results for input(s): CHOL, TRIG, LDLCALC, VLDL, HDL, CHOLHDL, LDLDIRECT in the last 8760 hours. Lipid Panel  No results found for: CHOL, TRIG, HDL, CHOLHDL, VLDL, LDLCALC, LDLDIRECT, LABVLDL   HEMOGLOBIN A1C No results found for: HGBA1C, MPG TSH No results for input(s): TSH in the last 8760 hours.  External labs:   Labs 07/29/2021:  Total cholesterol 166, triglycerides 70, HDL 58, LDL 94.  Non-HDL cholesterol 108.  Hb 13.6/HCT 39.0,  platelets 199, normal indicis.  Sodium 144, potassium 4.5, BUN 13, creatinine 0.70, EGFR >60 mL, CMP otherwise normal.  Alkaline phosphatase minimally elevated at 134. Medications and allergies  No Known Allergies   Medication prior to this encounter:   Outpatient Medications Prior to Visit  Medication Sig Dispense Refill   Calcium-Phosphorus-Vitamin D (CITRACAL  CALCIUM GUMMIES PO) Take 650 mg by mouth daily. Tabs     cyanocobalamin (,VITAMIN B-12,) 1000 MCG/ML injection Inject 1,000 mcg into the muscle every 30 (thirty) days.      levothyroxine (SYNTHROID) 112 MCG tablet Take 112 mcg by mouth daily before breakfast.  6   Multiple Vitamin (MULTIVITAMIN) tablet Take 1 tablet by mouth daily.     omeprazole (PRILOSEC) 20 MG capsule Take 20 mg by mouth daily as needed (acid reflux).     ZERVIATE 0.24 % SOLN Apply 1 drop to eye 2 (two) times daily.     amoxicillin (AMOXIL) 500 MG tablet Take 500 mg by mouth 2 (two) times daily.     NOREL AD 4-10-325 MG TABS Take 1 tablet by mouth 2 (two) times daily.     PEG-KCl-NaCl-NaSulf-Na Asc-C (PLENVU) 140 g SOLR Take 1 kit by mouth as directed. 1 each 0   No facility-administered medications prior to visit.     Medication list after today's encounter   Current Outpatient Medications  Medication Instructions   Calcium-Phosphorus-Vitamin D (CITRACAL CALCIUM GUMMIES PO) 650 mg, Oral, Daily, Tabs   cyanocobalamin ((VITAMIN B-12)) 1,000 mcg, Intramuscular, Every 30 days   levothyroxine (SYNTHROID) 112 mcg, Oral, Daily before breakfast   Multiple Vitamin (MULTIVITAMIN) tablet 1 tablet, Oral, Daily,     omeprazole (PRILOSEC) 20 mg, Oral, Daily PRN   ZERVIATE 0.24 % SOLN 1 drop, Ophthalmic, 2 times daily    Radiology:   No results found.  Cardiac Studies:  NA  EKG:   EKG 09/13/2021: Normal sinus rhythm/sinus bradycardia at rate 58 bpm, normal axis, no evidence of ischemia, normal EKG.  Low voltage noted in the limb leads only.  EKG 07/29/2021:  Sinus bradycardia at rate of 51 bpm, normal axis.  No evidence of ischemia.  Low-voltage complexes.  Assessment     ICD-10-CM   1. Vasovagal syncope  R55 EKG 12-Lead    PCV ECHOCARDIOGRAM COMPLETE    2. Class 3 severe obesity due to excess calories with serious comorbidity and body mass index (BMI) of 50.0 to 59.9 in adult [facility])  E66.01    Z68.43        Medications Discontinued During This Encounter  Medication Reason   NOREL AD 4-10-325 MG TABS    PEG-KCl-NaCl-NaSulf-Na Asc-C (PLENVU) 140 g SOLR    amoxicillin (AMOXIL) 500 MG tablet     No orders of the defined types were placed in this encounter.  Orders Placed This Encounter  Procedures   EKG 12-Lead   PCV ECHOCARDIOGRAM COMPLETE    Standing Status:   Future    Standing Expiration Date:   09/13/2022   Recommendations:   Crystal Wall is a 55 y.o. Caucasian female with morbid obesity referred to me for evaluation of syncope.  She has no hypertension, diabetes, hyperlipidemia.  She has history of gastric bypass surgery about 15 years ago but unfortunately gained the weight back.  Patient has had 3 episodes of falls, starting in August when she was walking in the hallway suddenly fell.  She was with her daughter.  No history to suggest loss of consciousness.  Last episode happened while she was trying to get into the porch and fell.  Again no clear-cut history to suggest syncope.  If any I suspect these are frequent falls, vasovagal syncope is remotely possible.  I do not suspect cardiac etiology.  Not episodes have happened while she is standing, sitting or laying down or while moving with clear loss  of consciousness.  Hence at this point except for obtaining an echocardiogram in view of low-voltage EKG complex probably elated to morbid obesity, watchful waiting is indicated.  Extensive discussion with the patient regarding this.  Weight loss was discussed with the patient again and I given her some tips about reducing her  calorie intake.  I have made her visits as needed with me but she is aware to contact me at any point if she has any recurrence.  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  No need for evaluation for ischemic heart disease or arrhythmias as there is no palpitations, chest pain or dyspnea.  External labs reviewed.    Adrian Prows, MD, [facility] 09/13/2021, 11:16 AM Office: [phone]

## 2021-09-14 ENCOUNTER — Ambulatory Visit
Admission: RE | Admit: 2021-09-14 | Discharge: 2021-09-14 | Disposition: A | Discharge status: Home / Self Care | Payer: 59 | Source: Ambulatory Visit | Attending: Internal Medicine | Admitting: Internal Medicine

## 2021-09-14 DIAGNOSIS — R55 Syncope and collapse: Secondary | ICD-10-CM

## 2021-09-18 ENCOUNTER — Ambulatory Visit: Payer: 59 | Admitting: Nurse Practitioner

## 2021-09-19 ENCOUNTER — Other Ambulatory Visit: Payer: Self-pay

## 2021-09-19 ENCOUNTER — Ambulatory Visit: Payer: 59

## 2021-09-19 DIAGNOSIS — R55 Syncope and collapse: Secondary | ICD-10-CM

## 2021-09-24 ENCOUNTER — Other Ambulatory Visit: Payer: Self-pay

## 2021-09-24 ENCOUNTER — Ambulatory Visit (INDEPENDENT_AMBULATORY_CARE_PROVIDER_SITE_OTHER): Payer: 59 | Admitting: Physical Medicine and Rehabilitation

## 2021-09-24 ENCOUNTER — Encounter: Payer: Self-pay | Admitting: Physical Medicine and Rehabilitation

## 2021-09-24 ENCOUNTER — Ambulatory Visit: Payer: Self-pay

## 2021-09-24 DIAGNOSIS — M25551 Pain in right hip: Secondary | ICD-10-CM | POA: Diagnosis not present

## 2021-09-24 NOTE — Progress Notes (Signed)
Pt state right hip pain. Pt state getting up from an sitting portion makes the pain worse. Pt state she doesn't takes anything for the pain. Pt state the hip inj helps a lot.  Numeric Pain Rating Scale and Functional Assessment Average Pain 5   In the last MONTH (on 0-10 scale) has pain interfered with the following?  1. General activity like being  able to carry out your everyday physical activities such as walking, climbing stairs, carrying groceries, or moving a chair?  Rating(8)    -BT, -Dye Allergies.

## 2021-09-24 NOTE — Progress Notes (Signed)
° °  Crystal Wall - 55 y.o. female MRN [id number]  Date of birth: 04/15/1966  Office Visit Note: Visit Date: 09/24/2021 PCP: Jilda Panda, MD Referred by: Magnus Sinning, MD  Subjective: Chief Complaint  Patient presents with   Right Hip - Pain   HPI:  Crystal Wall is a 55 y.o. female who comes in today for planned repeat Right anesthetic hip arthrogram with fluoroscopic guidance.  The patient has failed conservative care including home exercise, medications, time and activity modification. Prior injection gave more than 50% relief for several months. This injection will be diagnostic and hopefully therapeutic.  Please see requesting physician notes for further details and justification.  Referring: Dr. Landry Dyke Dean  ROS Otherwise per HPI.  Assessment & Plan: Visit Diagnoses:    ICD-10-CM   1. Pain in right hip  M25.551 XR C-ARM NO REPORT    Large Joint Inj: R hip joint      Plan: No additional findings.   Meds & Orders: No orders of the defined types were placed in this encounter.   Orders Placed This Encounter  Procedures   Large Joint Inj: R hip joint   XR C-ARM NO REPORT    Follow-up: No follow-ups on file.   Procedures: Large Joint Inj: R hip joint on 09/24/2021 2:25 PM Indications: diagnostic evaluation and pain Details: 22 G 3.5 in needle, fluoroscopy-guided anterior approach  Arthrogram: No  Medications: 4 mL bupivacaine 0.25 %; 60 mg triamcinolone acetonide 40 MG/ML Outcome: tolerated well, no immediate complications  There was excellent flow of contrast producing a partial arthrogram of the hip. The patient did have relief of symptoms during the anesthetic phase of the injection. Procedure, treatment alternatives, risks and benefits explained, specific risks discussed. Consent was given by the patient. Immediately prior to procedure a time out was called to verify the correct patient, procedure, equipment, support staff and site/side marked as  required. Patient was prepped and draped in the usual sterile fashion.         Clinical History: No specialty comments available.     Objective:  VS:  HT:     WT:    BMI:      BP:    HR: bpm   TEMP: ( )   RESP:  Physical Exam   Imaging: XR C-ARM NO REPORT  Result Date: 09/24/2021 Please see Notes tab for imaging impression.

## 2021-09-25 MED ORDER — BUPIVACAINE HCL 0.25 % IJ SOLN
4.0000 mL | INTRAMUSCULAR | Status: AC | PRN
  Administered 2021-09-24: 14:00:00 4 mL via INTRA_ARTICULAR

## 2021-09-25 MED ORDER — TRIAMCINOLONE ACETONIDE 40 MG/ML IJ SUSP
60.0000 mg | INTRAMUSCULAR | Status: AC | PRN
Start: 2021-09-24 — End: 2021-09-24
  Administered 2021-09-24: 14:00:00 60 mg via INTRA_ARTICULAR

## 2021-10-01 NOTE — Progress Notes (Signed)
° °  Crystal Wall 24-Mar-1966 [id number]   History:  55 y.o. G1P1 presents for annual exam. Postmenopausal - no HRT, no bleeding. Normal pap history. Hypothyroidism.   Gynecologic History Patient's last menstrual period was 09/05/2014.   Contraception/Family planning: post menopausal status Sexually active: Yes  Health Maintenance Last Pap: 08/26/2019. Results were: Normal, 5-year repeat Last mammogram: 09/2021. Results were: Normal Last colonoscopy: 02/05/2021. Results were: Benign polyps, 2-year recall Last Dexa: Not indicated  Past medical history, past surgical history, family history and social history were all reviewed and documented in the EPIC chart. 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 Sister with history of colon and uterine cancer.   ROS:  A ROS was performed and pertinent positives and negatives are included.  Exam:  Vitals:   10/02/21 0802  BP: 122/78  Pulse: 67  SpO2: 94%  Weight: (!) 301 lb (136.5 kg)  Height: 5\' 2"  (1.575 m)   Body mass index is 55.05 kg/m.  General appearance:  Normal Thyroid:  Symmetrical, normal in size, without palpable masses or nodularity. Respiratory  Auscultation:  Clear without wheezing or rhonchi Cardiovascular  Auscultation:  Regular rate, without rubs, murmurs or gallops  Edema/varicosities:  Not grossly evident Abdominal  Soft,nontender, without masses, guarding or rebound.  Liver/spleen:  No organomegaly noted  Hernia:  None appreciated  Skin  Inspection:  Grossly normal Breasts: Examined lying and sitting.   Right: Without masses, retractions, nipple discharge or axillary adenopathy.   Left: Without masses, retractions, nipple discharge or axillary adenopathy. Genitourinary   Inguinal/mons:  Normal without inguinal adenopathy  External genitalia:  Normal appearing vulva with no masses, tenderness, or lesions  BUS/Urethra/Skene's glands:  Normal  Vagina:  Normal appearing with normal color and discharge, no  lesions  Cervix:  Normal appearing without discharge or lesions  Uterus:  Difficult to palpate but no gross masses or tenderness  Adnexa/parametria:     Rt: Normal in size, without masses or tenderness.   Lt: Normal in size, without masses or tenderness.  Anus and perineum: Normal  Digital rectal exam: Normal sphincter tone without palpated masses or tenderness  Patient informed chaperone available to be present for breast and pelvic exam. Patient has requested no chaperone to be present. Patient has been advised what will be completed during breast and pelvic exam.   Assessment/Plan:  55 y.o. G1P1 for annual exam.   Well female exam with routine gynecological exam - Education provided on SBEs, importance of preventative screenings, current guidelines, high calcium diet, regular exercise, and multivitamin daily. Labs with PCP.   Postmenopausal - no HRT, no bleeding.   Screening for cervical cancer - Normal Pap history.  Will repeat at 5-year interval per guidelines.  Screening for breast cancer - Normal mammogram history.  Continue annual screenings.  Normal breast exam today.  Screening for colon cancer - 02/2021 colonoscopy - benign polyps. Will repeat at 2-year interval per GI's recommendation d/t personal hx of polyps and family hx.   Screening for osteoporosis - Will plan DXA further into menopause.   Return in 1 year for annual.   Tamela Gammon DNP, 8:21 AM 10/02/2021

## 2021-10-02 ENCOUNTER — Encounter: Payer: Self-pay | Admitting: Nurse Practitioner

## 2021-10-02 ENCOUNTER — Ambulatory Visit (INDEPENDENT_AMBULATORY_CARE_PROVIDER_SITE_OTHER): Payer: 59 | Admitting: Nurse Practitioner

## 2021-10-02 ENCOUNTER — Other Ambulatory Visit: Payer: Self-pay

## 2021-10-02 VITALS — BP 122/78 | HR 67 | Ht 62.0 in | Wt 301.0 lb

## 2021-10-02 DIAGNOSIS — Z780 Asymptomatic menopausal state: Secondary | ICD-10-CM | POA: Diagnosis not present

## 2021-10-02 DIAGNOSIS — Z01419 Encounter for gynecological examination (general) (routine) without abnormal findings: Secondary | ICD-10-CM

## 2021-10-24 ENCOUNTER — Encounter (HOSPITAL_BASED_OUTPATIENT_CLINIC_OR_DEPARTMENT_OTHER): Payer: Self-pay

## 2021-10-24 ENCOUNTER — Emergency Department (HOSPITAL_BASED_OUTPATIENT_CLINIC_OR_DEPARTMENT_OTHER): Payer: 59

## 2021-10-24 ENCOUNTER — Other Ambulatory Visit: Payer: Self-pay

## 2021-10-24 ENCOUNTER — Emergency Department (HOSPITAL_BASED_OUTPATIENT_CLINIC_OR_DEPARTMENT_OTHER)
Admission: EM | Admit: 2021-10-24 | Discharge: 2021-10-24 | Disposition: A | Discharge status: Home / Self Care | Payer: 59 | Attending: Emergency Medicine | Admitting: Emergency Medicine

## 2021-10-24 DIAGNOSIS — Z23 Encounter for immunization: Secondary | ICD-10-CM | POA: Insufficient documentation

## 2021-10-24 DIAGNOSIS — W19XXXA Unspecified fall, initial encounter: Secondary | ICD-10-CM

## 2021-10-24 DIAGNOSIS — M79641 Pain in right hand: Secondary | ICD-10-CM | POA: Insufficient documentation

## 2021-10-24 DIAGNOSIS — S025XXA Fracture of tooth (traumatic), initial encounter for closed fracture: Secondary | ICD-10-CM | POA: Diagnosis not present

## 2021-10-24 DIAGNOSIS — S01411A Laceration without foreign body of right cheek and temporomandibular area, initial encounter: Secondary | ICD-10-CM | POA: Insufficient documentation

## 2021-10-24 DIAGNOSIS — G8911 Acute pain due to trauma: Secondary | ICD-10-CM | POA: Insufficient documentation

## 2021-10-24 DIAGNOSIS — W010XXA Fall on same level from slipping, tripping and stumbling without subsequent striking against object, initial encounter: Secondary | ICD-10-CM | POA: Insufficient documentation

## 2021-10-24 DIAGNOSIS — S0993XA Unspecified injury of face, initial encounter: Secondary | ICD-10-CM | POA: Diagnosis present

## 2021-10-24 MED ORDER — TETANUS-DIPHTH-ACELL PERTUSSIS 5-2.5-18.5 LF-MCG/0.5 IM SUSY
0.5000 mL | PREFILLED_SYRINGE | Freq: Once | INTRAMUSCULAR | Status: AC
  Administered 2021-10-24: 0.5 mL via INTRAMUSCULAR
  Filled 2021-10-24: qty 0.5

## 2021-10-24 NOTE — ED Notes (Signed)
Patient transported to X-ray, will get vitals once Pt is back in room.

## 2021-10-24 NOTE — Discharge Instructions (Signed)
The wound on your face will heal on their own, recommend washing the area 2 times daily and apply new dressings.   Right hand pain likely a muscular strain, keep it elevated will not in use this help decrease swelling inflammation, over-the-counter.  As needed.  Follow-up your PCP as needed.   Come back to the emergency department if you develop chest pain, shortness of breath, severe abdominal pain, uncontrolled nausea, vomiting, diarrhea.

## 2021-10-24 NOTE — ED Triage Notes (Signed)
PT arrives ambulatory to ED after a fall at work today states that she was walking back into the office and her foot caught the curb. Pt has small laceration to right face from glasses, abrasion to right upper lip with chipped tooth, pain and swelling to right hand and also c/o pain to right knee.

## 2021-10-24 NOTE — ED Provider Notes (Signed)
[facility] Provider Note   CSN: [id number] Arrival date & time: 10/24/21  1410     History  Chief Complaint  Patient presents with   [name]    Crystal Wall is a 56 y.o. female.  HPI  Patient without significant medical history presents with complaints of a fall.  Patient states while she was walking to her office building her right leg caught the edge of the stair causing her fall onto her right side.  She states that her face hit the ground but she denies loss of consciousness, is not on anticoagulants.  She states that she has pain mainly in her right hand and the Oma side of her fifth metacarpal, she also notes that she has a small cut on the right side of her cheek where her glasses hit her cheek and as well as a small abrasion on her lip and chipped right upper tooth.  she denies any headaches, change in vision, paresthesias or weakness in the upper or lower extremities, she denies any neck pain, back pain, chest pain, pain or other 3 extremities.  She not immunocompromise does not rule out some had a tetanus shot.  Home Medications Prior to Admission medications   Medication Sig Start Date End Date Taking? Authorizing Provider  Calcium-Phosphorus-Vitamin D (CITRACAL CALCIUM GUMMIES PO) Take 650 mg by mouth daily. Tabs    [provider]  cyanocobalamin (,VITAMIN B-12,) 1000 MCG/ML injection Inject 1,000 mcg into the muscle every 30 (thirty) days.     [provider]  levothyroxine (SYNTHROID) 112 MCG tablet Take 112 mcg by mouth daily before breakfast. 03/09/18   [provider]  Multiple Vitamin (MULTIVITAMIN) tablet Take 1 tablet by mouth daily.    [provider]  omeprazole (PRILOSEC) 20 MG capsule Take 20 mg by mouth daily as needed (acid reflux).    [provider]      Allergies    Patient has no known allergies.    Review of Systems   Review of Systems  Constitutional:  Negative for chills  and fever.  Respiratory:  Negative for shortness of breath.   Cardiovascular:  Negative for chest pain.  Gastrointestinal:  Negative for abdominal pain.  Musculoskeletal:        Right hand pain.  Skin:  Positive for wound.  Neurological:  Negative for headaches.   Physical Exam Updated Vital Signs BP (!) 145/67 (BP Location: Left Arm)    Pulse 70    Temp 98.4 F (36.9 C) (Oral)    Resp 16    Ht 5\' 2"  (1.575 m)    Wt (!) 139.3 kg    LMP 09/05/2014    SpO2 99%    BMI 56.15 kg/m  Physical Exam Vitals and nursing note reviewed.  Constitutional:      General: She is not in acute distress.    Appearance: She is not ill-appearing.  HENT:     Head: Normocephalic and atraumatic.     Comments: Has a small superficial laceration on the right upper cheek, hemodynamically stable, small abrasion on right upper lip he medically stable.  There is no raccoon eyes about sign present, head was nontender to palpation.    Nose: No congestion.     Mouth/Throat:     Mouth: Mucous membranes are moist.     Pharynx: Oropharynx is clear.     Comments: No trismus or torticollis, she has a small chipped right front incisor, no other dental trauma  present. Eyes:     Extraocular Movements: Extraocular movements intact.     Conjunctiva/sclera: Conjunctivae normal.     Pupils: Pupils are equal, round, and reactive to light.  Cardiovascular:     Rate and Rhythm: Normal rate and regular rhythm.     Pulses: Normal pulses.     Heart sounds: No murmur heard.   No friction rub. No gallop.  Pulmonary:     Effort: No respiratory distress.     Breath sounds: No wheezing, rhonchi or rales.  Chest:     Chest wall: No tenderness.  Musculoskeletal:     Right lower leg: No edema.     Left lower leg: No edema.     Comments: Spine was palpated was nontender to palpation, no step-off or deformities noted, she is moving all 4 extremitie.  Patient does have some ecchymosis noted on the posterior aspect of the right hand  along the fourth and fifth metatarsal distally, slightly tender to palpation no crepitus, neurovascular fully intact.  No pelvis instability, no leg shortening, able to ambulate without difficulty.  Skin:    General: Skin is warm and dry.  Neurological:     Mental Status: She is alert.     Comments: No facial asymmetry, no difficult word finding, able follow two-step commands, no unilateral weakness present.  Psychiatric:        Mood and Affect: Mood normal.    ED Results / Procedures / Treatments   Labs (all labs ordered are listed, but only abnormal results are displayed) Labs Reviewed - No data to display  EKG None  Radiology DG Hand Complete Right  Result Date: 10/24/2021 CLINICAL DATA:  Fifth metacarpal tenderness EXAM: RIGHT HAND - COMPLETE 3+ VIEW COMPARISON:  10/07/2018 FINDINGS: Generalized osteopenia. No acute fracture or dislocation. No aggressive osseous lesion. Severe osteoarthritis of the first [facility] joint. Mild osteoarthritis of the scaphotrapeziotrapezoid joint. Mild osteoarthritis of the PIP and D IP joints. Soft tissues are unremarkable. IMPRESSION: No acute osseous injury of the right hand. Electronically Signed   By: Kathreen Devoid M.D.   On: 10/24/2021 15:32    Procedures Procedures    Medications Ordered in ED Medications  Tdap (BOOSTRIX) injection 0.5 mL (0.5 mLs Intramuscular Given 10/24/21 1552)    ED Course/ Medical Decision Making/ A&P                           Medical Decision Making Amount and/or Complexity of Data Reviewed Radiology: ordered.  Risk Prescription drug management.   This patient presents to the ED for concern of fall, this involves an extensive number of treatment options, and is a complaint that carries with it a high risk of complications and morbidity.  The differential diagnosis includes orthopedic injury, deep tissue wound    Additional history obtained:  Additional history obtained from N/A  Co morbidities that complicate  the patient evaluation  N/A  Social Determinants of Health:  N/A    Lab Tests:  I Ordered, and personally interpreted labs.  The pertinent results include: N/A   Imaging Studies ordered:  I ordered imaging studies including right hand I independently visualized and interpreted imaging which showed negative for acute findings I agree with the radiologist interpretation   Test Considered:  CT imaging of head but will defer is a very low suspicion for intracranial head bleed at this time there is no loss of conscious, she is not on anticoagulant, is endorsing headaches,  change in vision paresthesia weakness upper lower extremity, no for dose present my exam.    Rule out  Low suspicion for spinal cord abnormality or spinal fracture spine was palpated was nontender to palpation, patient has full range of motion in the upper and lower extremities.  Low suspicion for intrathoracic or intra-abdominal trauma as chest abdomen both nontender to palpation.  Low suspicion for orthopedic injury as imaging is negative for acute findings.  Patient has a laceration noted on her right upper cheek superficial nature hemodynamically stable does not require suturing at this time.     Dispostion and problem list  After consideration of the diagnostic results and the patients response to treatment, I feel that the patent would benefit from   Laceration, abrasion-recommend basic wound care, follow-up with PCP as needed. Right hand pain-likely muscular in nature, recommend over-the-counter pain medications, follow-up PCP as needed. Chipped tooth-follow-up with dentist for further evaluation.            Final Clinical Impression(s) / ED Diagnoses Final diagnoses:  Fall, initial encounter  Right hand pain  Closed fracture of tooth, initial encounter    Rx / DC Orders ED Discharge Orders     None         Marcello Fennel, PA-C 10/24/21 1558    Sherwood Gambler,  MD 10/24/21 1724

## 2022-07-21 ENCOUNTER — Telehealth: Payer: Self-pay | Admitting: Physical Medicine and Rehabilitation

## 2022-07-21 NOTE — Telephone Encounter (Signed)
Pt would like to repeat right hip injection. The injection has helped up until now.

## 2022-07-31 ENCOUNTER — Ambulatory Visit: Payer: 59 | Admitting: Sports Medicine

## 2022-07-31 ENCOUNTER — Ambulatory Visit: Payer: Self-pay

## 2022-07-31 ENCOUNTER — Encounter: Payer: Self-pay | Admitting: Sports Medicine

## 2022-07-31 VITALS — BP 145/79 | HR 72

## 2022-07-31 DIAGNOSIS — M25551 Pain in right hip: Secondary | ICD-10-CM | POA: Diagnosis not present

## 2022-07-31 DIAGNOSIS — M1611 Unilateral primary osteoarthritis, right hip: Secondary | ICD-10-CM | POA: Diagnosis not present

## 2022-07-31 MED ORDER — LIDOCAINE HCL 1 % IJ SOLN
4.0000 mL | INTRAMUSCULAR | Status: AC | PRN
  Administered 2022-07-31: 4 mL

## 2022-07-31 MED ORDER — METHYLPREDNISOLONE ACETATE 40 MG/ML IJ SUSP
80.0000 mg | INTRAMUSCULAR | Status: AC | PRN
  Administered 2022-07-31: 80 mg via INTRA_ARTICULAR

## 2022-07-31 NOTE — Progress Notes (Signed)
Right hip pain; here today for injection Has had 2 in the past, 1 seemed to work better than the other   Not diabetic

## 2022-07-31 NOTE — Progress Notes (Signed)
   Procedure Note  Patient: Crystal Wall             Date of Birth: 03/04/1966           MRN: [id number]             Visit Date: 07/31/2022  Procedures: Visit Diagnoses:  1. Pain in right hip   2. Unilateral primary osteoarthritis, right hip    Large Joint Inj: R hip joint on 07/31/2022 9:48 AM Indications: pain Details: 22 G 3.5 in needle, ultrasound-guided anterior approach Medications: 4 mL lidocaine 1 %; 80 mg methylPREDNISolone acetate 40 MG/ML Outcome: tolerated well, no immediate complications  Procedure: US-guided intra-articular hip injection, Right After discussion on risks/benefits/indications and informed verbal consent was obtained, a timeout was performed. Patient was lying supine on exam table. The hip was cleaned with Chloraprep and alcohol swabs. Then utilizing ultrasound guidance, the patient's femoral head and neck junction was identified and subsequently injected with 4:2 lidocaine:depomedrol via an in-plane approach with ultrasound visualization of the injectate administered into the hip joint. Patient tolerated procedure well without immediate complications.  Procedure, treatment alternatives, risks and benefits explained, specific risks discussed. Consent was given by the patient. Immediately prior to procedure a time out was called to verify the correct patient, procedure, equipment, support staff and site/side marked as required. Patient was prepped and draped in the usual sterile fashion.    - I evaluated the patient about 10 minutes post-injection and they had improvement in pain and range of motion - follow-up with Dr. Marlou Sa as indicated; I am happy to see them as needed  Elba Barman, DO [facility]  This note was dictated using Dragon naturally speaking software and may contain errors in syntax, spelling, or content which have not been identified prior to signing this note.

## 2022-08-08 ENCOUNTER — Telehealth: Payer: Self-pay | Admitting: Gastroenterology

## 2022-08-08 NOTE — Telephone Encounter (Signed)
Inbound call for patient ready to schedule hospital colon recall. Please advise.  Thank you

## 2022-08-08 NOTE — Telephone Encounter (Signed)
Returned call to patient. Pt is not due for recall colonoscopy until 02/2023. This recall is in epic. I told pt that we do not have the May 2024 schedule available but she will receive a letter when she is due. Pt verbalized understanding and had no concerns at the end of the call.

## 2022-10-08 ENCOUNTER — Ambulatory Visit: Payer: 59 | Admitting: Nurse Practitioner

## 2022-10-13 ENCOUNTER — Encounter: Payer: Self-pay | Admitting: Nurse Practitioner

## 2022-10-16 ENCOUNTER — Ambulatory Visit (INDEPENDENT_AMBULATORY_CARE_PROVIDER_SITE_OTHER): Payer: 59 | Admitting: Nurse Practitioner

## 2022-10-16 ENCOUNTER — Encounter: Payer: Self-pay | Admitting: Nurse Practitioner

## 2022-10-16 VITALS — BP 124/80 | Ht 62.0 in | Wt 335.0 lb

## 2022-10-16 DIAGNOSIS — Z01419 Encounter for gynecological examination (general) (routine) without abnormal findings: Secondary | ICD-10-CM | POA: Diagnosis not present

## 2022-10-16 DIAGNOSIS — Z780 Asymptomatic menopausal state: Secondary | ICD-10-CM

## 2022-10-16 NOTE — Progress Notes (Signed)
   Crystal Wall [birth year] [id number]   History:  57 y.o. G1P1 presents for annual exam without GYN complaints. Postmenopausal - no HRT, no bleeding. Normal pap history. Hypothyroidism managed by PCP.   Gynecologic History Patient's last menstrual period was 09/05/2014.   Contraception/Family planning: post menopausal status Sexually active: Yes  Health Maintenance Last Pap: 08/26/2019. Results were: Normal, 5-year repeat Last mammogram: 10/10/2022. Results were: Normal Last colonoscopy: 02/05/2021. Results were: Benign polyps, 2-year recall Last Dexa: Not indicated  Past medical history, past surgical history, family history and social history were all reviewed and documented in the EPIC chart. 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 Sister with history of colon and uterine cancer.   ROS:  A ROS was performed and pertinent positives and negatives are included.  Exam:  Vitals:   10/16/22 0902  BP: 124/80  Weight: (!) 335 lb (152 kg)  Height: '5\' 2"'$  (1.575 m)    Body mass index is 61.27 kg/m.  General appearance:  Normal Thyroid:  Symmetrical, normal in size, without palpable masses or nodularity. Respiratory  Auscultation:  Clear without wheezing or rhonchi Cardiovascular  Auscultation:  Regular rate, without rubs, murmurs or gallops  Edema/varicosities:  Not grossly evident Abdominal  Soft,nontender, without masses, guarding or rebound.  Liver/spleen:  No organomegaly noted  Hernia:  None appreciated  Skin  Inspection:  Grossly normal Breasts: Examined lying and sitting.   Right: Without masses, retractions, nipple discharge or axillary adenopathy.   Left: Without masses, retractions, nipple discharge or axillary adenopathy. Genitourinary   Inguinal/mons:  Normal without inguinal adenopathy  External genitalia:  Normal appearing vulva with no masses, tenderness, or lesions  BUS/Urethra/Skene's glands:  Normal  Vagina:  Normal appearing with normal color and discharge, no  lesions  Cervix:  Normal appearing without discharge or lesions  Uterus:  Difficult to palpate but no gross masses or tenderness  Adnexa/parametria:     Rt: Normal in size, without masses or tenderness.   Lt: Normal in size, without masses or tenderness.  Anus and perineum: Normal  Digital rectal exam: Deferred  Patient informed chaperone available to be present for breast and pelvic exam. Patient has requested no chaperone to be present. Patient has been advised what will be completed during breast and pelvic exam.   Assessment/Plan:  57 y.o. G1P1 for annual exam.   Well female exam with routine gynecological exam - Education provided on SBEs, importance of preventative screenings, current guidelines, high calcium diet, regular exercise, and multivitamin daily. Labs with PCP.   Postmenopausal - no HRT, no bleeding.   Screening for cervical cancer - Normal Pap history.  Will repeat at 5-year interval per guidelines.  Screening for breast cancer - Normal mammogram history.  Continue annual screenings.  Normal breast exam today.  Screening for colon cancer - 02/2021 colonoscopy - benign polyps. Will repeat at 2-year interval per GI's recommendation d/t personal hx of polyps and family hx.   Screening for osteoporosis - Will plan DXA further into menopause.   Return in 1 year for annual.     Tamela Gammon DNP, 9:13 AM 10/16/2022

## 2023-01-23 ENCOUNTER — Telehealth: Payer: Self-pay | Admitting: Gastroenterology

## 2023-01-23 NOTE — Telephone Encounter (Signed)
Inbound call from patient wanting to schedule for hospital procedure for may. Please advise.  Thank you

## 2023-01-29 ENCOUNTER — Other Ambulatory Visit: Payer: Self-pay

## 2023-01-29 DIAGNOSIS — Z86010 Personal history of colonic polyps: Secondary | ICD-10-CM

## 2023-01-29 NOTE — Telephone Encounter (Signed)
Called and left patient a detailed vm offering her a colonoscopy appt at [facility] on 03/19/23 at 10:30 am. Pt would need to arrive by 9 am with a care partner. Pt will need a PV appt. Pt has been advised to call back to confirm wether or not this appt will work for her.

## 2023-01-29 NOTE — Telephone Encounter (Signed)
Pt returned call. She has been scheduled for a PV on Friday, 02/13/23 at 8:30 am. Colonoscopy scheduled at [facility] on 03/19/23 at 10:30 am, arriving at 9 am.   Appt information mailed to patient.

## 2023-02-13 ENCOUNTER — Ambulatory Visit (AMBULATORY_SURGERY_CENTER): Payer: 59 | Admitting: *Deleted

## 2023-02-13 VITALS — Ht 62.0 in | Wt 343.2 lb

## 2023-02-13 DIAGNOSIS — Z800 Family history of malignant neoplasm of digestive organs: Secondary | ICD-10-CM

## 2023-02-13 DIAGNOSIS — Z86010 Personal history of colonic polyps: Secondary | ICD-10-CM

## 2023-02-13 NOTE — Progress Notes (Signed)
No egg or soy allergy known to patient  No issues known to pt with past sedation with any surgeries or procedures Patient denies ever being told they had issues or difficulty with intubation  No FH of Malignant Hyperthermia Pt is not on diet pills Pt is not on  home 02  Pt is not on blood thinners  Pt denies issues with constipation  No A fib or A flutter Have any cardiac testing pending--NO Pt instructed to use [email or web address] or GoodRx for a price reduction on prep    PLENVU SAMPLE GIVEN

## 2023-03-09 ENCOUNTER — Encounter: Payer: Self-pay | Admitting: Gastroenterology

## 2023-03-16 ENCOUNTER — Telehealth: Payer: Self-pay

## 2023-03-16 NOTE — Telephone Encounter (Signed)
Left patient a detailed vm asking that she call back to confirm her colonoscopy appt at [facility] on 03/19/23 at 10:15 am. Arriving at 8:45 am with a care partner. Also, need to confirm that patient has her prep and instructions.

## 2023-03-18 NOTE — Telephone Encounter (Signed)
Patient confirmed colonoscopy appt via MyChart message.

## 2023-03-19 ENCOUNTER — Ambulatory Visit (HOSPITAL_BASED_OUTPATIENT_CLINIC_OR_DEPARTMENT_OTHER): Payer: 59 | Admitting: Certified Registered Nurse Anesthetist

## 2023-03-19 ENCOUNTER — Encounter (HOSPITAL_COMMUNITY): Payer: Self-pay | Admitting: Gastroenterology

## 2023-03-19 ENCOUNTER — Encounter (HOSPITAL_COMMUNITY)
Admission: RE | Disposition: A | Discharge status: Home / Self Care | Payer: Self-pay | Source: Ambulatory Visit | Attending: Gastroenterology

## 2023-03-19 ENCOUNTER — Ambulatory Visit (HOSPITAL_COMMUNITY): Payer: 59 | Admitting: Certified Registered Nurse Anesthetist

## 2023-03-19 ENCOUNTER — Other Ambulatory Visit: Payer: Self-pay

## 2023-03-19 ENCOUNTER — Ambulatory Visit (HOSPITAL_COMMUNITY)
Admission: RE | Admit: 2023-03-19 | Discharge: 2023-03-19 | Disposition: A | Discharge status: Home / Self Care | Payer: 59 | Source: Ambulatory Visit | Attending: Gastroenterology | Admitting: Gastroenterology

## 2023-03-19 DIAGNOSIS — K219 Gastro-esophageal reflux disease without esophagitis: Secondary | ICD-10-CM | POA: Diagnosis not present

## 2023-03-19 DIAGNOSIS — K449 Diaphragmatic hernia without obstruction or gangrene: Secondary | ICD-10-CM | POA: Diagnosis not present

## 2023-03-19 DIAGNOSIS — Z800 Family history of malignant neoplasm of digestive organs: Secondary | ICD-10-CM

## 2023-03-19 DIAGNOSIS — D120 Benign neoplasm of cecum: Secondary | ICD-10-CM | POA: Diagnosis not present

## 2023-03-19 DIAGNOSIS — E039 Hypothyroidism, unspecified: Secondary | ICD-10-CM | POA: Insufficient documentation

## 2023-03-19 DIAGNOSIS — Z86010 Personal history of colonic polyps: Secondary | ICD-10-CM

## 2023-03-19 DIAGNOSIS — Z1211 Encounter for screening for malignant neoplasm of colon: Secondary | ICD-10-CM

## 2023-03-19 DIAGNOSIS — D123 Benign neoplasm of transverse colon: Secondary | ICD-10-CM

## 2023-03-19 DIAGNOSIS — D638 Anemia in other chronic diseases classified elsewhere: Secondary | ICD-10-CM

## 2023-03-19 DIAGNOSIS — D126 Benign neoplasm of colon, unspecified: Secondary | ICD-10-CM

## 2023-03-19 HISTORY — PX: COLONOSCOPY WITH PROPOFOL: SHX5780

## 2023-03-19 HISTORY — PX: POLYPECTOMY: SHX5525

## 2023-03-19 SURGERY — COLONOSCOPY WITH PROPOFOL
Anesthesia: Monitor Anesthesia Care

## 2023-03-19 MED ORDER — PROPOFOL 10 MG/ML IV BOLUS
INTRAVENOUS | Status: DC | PRN
  Administered 2023-03-19: 50 mg via INTRAVENOUS

## 2023-03-19 MED ORDER — PROPOFOL 500 MG/50ML IV EMUL
INTRAVENOUS | Status: DC | PRN
  Administered 2023-03-19 (×2): 100 ug/kg/min via INTRAVENOUS

## 2023-03-19 MED ORDER — LACTATED RINGERS IV SOLN: INTRAVENOUS | Status: DC

## 2023-03-19 SURGICAL SUPPLY — 22 items

## 2023-03-19 NOTE — Anesthesia Preprocedure Evaluation (Addendum)
Anesthesia Evaluation  Patient identified by MRN, date of birth, ID band Patient awake    Reviewed: Allergy & Precautions, NPO status , Patient's Chart, lab work & pertinent test results  Airway Mallampati: II  TM Distance: >3 FB Neck ROM: Full    Dental no notable dental hx.    Pulmonary neg pulmonary ROS   Pulmonary exam normal        Cardiovascular negative cardio ROS  Rhythm:Regular Rate:Normal     Neuro/Psych negative neurological ROS  negative psych ROS   GI/Hepatic Neg liver ROS, hiatal hernia,GERD  Medicated,,History of polyps   Endo/Other  Hypothyroidism    Renal/GU negative Renal ROS  negative genitourinary   Musculoskeletal negative musculoskeletal ROS (+)    Abdominal Normal abdominal exam  (+)   Peds  Hematology  (+) Blood dyscrasia, anemia   Anesthesia Other Findings   Reproductive/Obstetrics                             Anesthesia Physical Anesthesia Plan  ASA: 2  Anesthesia Plan: MAC   Post-op Pain Management:    Induction: Intravenous  PONV Risk Score and Plan: 2 and Propofol infusion and Treatment may vary due to age or medical condition  Airway Management Planned: Simple Face Mask and Nasal Cannula  Additional Equipment: None  Intra-op Plan:   Post-operative Plan:   Informed Consent: I have reviewed the patients History and Physical, chart, labs and discussed the procedure including the risks, benefits and alternatives for the proposed anesthesia with the patient or authorized representative who has indicated his/her understanding and acceptance.     Dental advisory given  Plan Discussed with:   Anesthesia Plan Comments:        Anesthesia Quick Evaluation

## 2023-03-19 NOTE — Interval H&P Note (Signed)
History and Physical Interval Note:  03/19/2023 11:25 AM  Crystal Wall  has presented today for surgery, with the diagnosis of hx of colon polyps.  The various methods of treatment have been discussed with the patient and family. After consideration of risks, benefits and other options for treatment, the patient has consented to  Procedure(s): COLONOSCOPY WITH PROPOFOL (N/A) as a surgical intervention.  The patient's history has been reviewed, patient examined, no change in status, stable for surgery.  I have reviewed the patient's chart and labs.  Questions were answered to the patient's satisfaction.     Charlie Pitter III

## 2023-03-19 NOTE — Anesthesia Procedure Notes (Signed)
Procedure Name: MAC Date/Time: 03/19/2023 11:30 AM  Performed by: Nils Pyle, CRNAPre-anesthesia Checklist: Patient identified, Emergency Drugs available, Suction available, Patient being monitored and Timeout performed Patient Re-evaluated:Patient Re-evaluated prior to induction Oxygen Delivery Method: Simple face mask Preoxygenation: Pre-oxygenation with 100% oxygen Induction Type: IV induction Placement Confirmation: positive ETCO2

## 2023-03-19 NOTE — Op Note (Signed)
[facility] Patient Name: Crystal Wall Procedure Date : 03/19/2023 MRN: [id number] Attending MD: Starr Lake. Myrtie Neither , MD, [id number] Date of Birth: 01-Jul-1966 CSN: [id number] Age: 57 Admit Type: Outpatient Procedure:                Colonoscopy Indications:              Screening in patient at increased risk: Colorectal                            cancer in sister before age 43, High risk colon                            cancer surveillance: Personal history of colonic                            polyps                           Nov 2019 - 15mm Cecal SSP                           Dec 2020 - 8 and 15mm Asc colon SSPs                           May 2022 - diminutive cecal and 8mm ICV SSPs Providers:                Sherilyn Cooter L. Myrtie Neither, MD, Marge Duncans, RN, Salley Scarlet, Technician, Rise Patience, CRNA Referring MD:              Medicines:                Monitored Anesthesia Care Complications:            No immediate complications. Estimated Blood Loss:     Estimated blood loss was minimal. Procedure:                Pre-Anesthesia Assessment:                           - Prior to the procedure, a History and Physical                            was performed, and patient medications and                            allergies were reviewed. The patient's tolerance of                            previous anesthesia was also reviewed. The risks                            and benefits of the procedure and the sedation  options and risks were discussed with the patient.                            All questions were answered, and informed consent                            was obtained. Prior Anticoagulants: The patient has                            taken no anticoagulant or antiplatelet agents. ASA                            Grade Assessment: III - A patient with severe                            systemic disease. After reviewing the  risks and                            benefits, the patient was deemed in satisfactory                            condition to undergo the procedure.                           After obtaining informed consent, the colonoscope                            was passed under direct vision. Throughout the                            procedure, the patient's blood pressure, pulse, and                            oxygen saturations were monitored continuously. The                            CF-HQ190L ([id number]) Olympus coloscope was                            introduced through the anus and advanced to the the                            terminal ileum, with identification of the                            appendiceal orifice and IC valve. The colonoscopy                            was performed without difficulty. The patient                            tolerated the procedure well. The quality of the  bowel preparation was excellent. The terminal                            ileum, ileocecal valve, appendiceal orifice, and                            rectum were photographed. Scope In: 11:38:13 AM Scope Out: 11:57:27 AM Scope Withdrawal Time: 0 hours 17 minutes 2 seconds  Total Procedure Duration: 0 hours 19 minutes 14 seconds  Findings:      The perianal and digital rectal examinations were normal.      The terminal ileum appeared normal.      Repeat examination of right colon under NBI performed.      Three sessile and semi-sessile polyps were found in the proximal       transverse colon and cecum. The polyps were 2 to 5 mm in size. These       polyps were removed with a cold snare. Resection and retrieval were       complete.      Two mucous-capped and semi-sessile polyps were found in the transverse       colon. The polyps were 4 to 10 mm in size. These polyps were removed       with a cold snare. Resection and retrieval were complete.      The exam was otherwise without  abnormality on direct and retroflexion       views. Impression:               - The examined portion of the ileum was normal.                           - Three 2 to 5 mm polyps in the proximal transverse                            colon and in the cecum, removed with a cold snare.                            Resected and retrieved.                           - Two 4 to 10 mm polyps in the transverse colon,                            removed with a cold snare. Resected and retrieved.                           - The examination was otherwise normal on direct                            and retroflexion views. Recommendation:           - Patient has a contact number available for                            emergencies. The signs and symptoms of potential  delayed complications were discussed with the                            patient. Return to normal activities tomorrow.                            Written discharge instructions were provided to the                            patient.                           - Resume previous diet.                           - Continue present medications.                           - Await pathology results.                           - Repeat colonoscopy is recommended for                            surveillance. The colonoscopy date will be                            determined after pathology results from today's                            exam become available for review. (most likely in                            2-3 years given polyp history and today's findings) Procedure Code(s):        --- Professional ---                           [phone], Colonoscopy, flexible; with removal of                            tumor(s), polyp(s), or other lesion(s) by snare                            technique Diagnosis Code(s):        --- Professional ---                           Z80.0, Family history of malignant neoplasm of                             digestive organs                           Z86.010, Personal history of colonic polyps                           D12.0, Benign  neoplasm of cecum                           D12.3, Benign neoplasm of transverse colon (hepatic                            flexure or splenic flexure) CPT copyright 2022 [employer]. All rights reserved. The codes documented in this report are preliminary and upon coder review may  be revised to meet current compliance requirements. Jeremyah Jelley L. Myrtie Neither, MD 03/19/2023 12:04:39 PM This report has been signed electronically. Number of Addenda: 0

## 2023-03-19 NOTE — Discharge Instructions (Signed)
YOU HAD AN ENDOSCOPIC PROCEDURE TODAY: Refer to the procedure report and other information in the discharge instructions given to you for any specific questions about what was found during the examination. If this information does not answer your questions, please call [place] office at [phone] to clarify.   YOU SHOULD EXPECT: Some feelings of bloating in the abdomen. Passage of more gas than usual. Walking can help get rid of the air that was put into your GI tract during the procedure and reduce the bloating. If you had a lower endoscopy (such as a colonoscopy or flexible sigmoidoscopy) you may notice spotting of blood in your stool or on the toilet paper. Some abdominal soreness may be present for a day or two, also.  DIET: Your first meal following the procedure should be a light meal and then it is ok to progress to your normal diet. A half-sandwich or bowl of soup is an example of a good first meal. Heavy or fried foods are harder to digest and may make you feel nauseous or bloated. Drink plenty of fluids but you should avoid alcoholic beverages for 24 hours. If you had a esophageal dilation, please see attached instructions for diet.    ACTIVITY: Your care partner should take you home directly after the procedure. You should plan to take it easy, moving slowly for the rest of the day. You can resume normal activity the day after the procedure however YOU SHOULD NOT DRIVE, use power tools, machinery or perform tasks that involve climbing or major physical exertion for 24 hours (because of the sedation medicines used during the test).   SYMPTOMS TO REPORT IMMEDIATELY: A gastroenterologist can be reached at any hour. Please call [phone]  for any of the following symptoms:  Following lower endoscopy (colonoscopy, flexible sigmoidoscopy) Excessive amounts of blood in the stool  Significant tenderness, worsening of abdominal pains  Swelling of the abdomen that is new, acute  Fever of 100 or  higher  Following upper endoscopy (EGD, EUS, ERCP, esophageal dilation) Vomiting of blood or coffee ground material  New, significant abdominal pain  New, significant chest pain or pain under the shoulder blades  Painful or persistently difficult swallowing  New shortness of breath  Black, tarry-looking or red, bloody stools  FOLLOW UP:  If any biopsies were taken you will be contacted by phone or by letter within the next 1-3 weeks. Call [phone]  if you have not heard about the biopsies in 3 weeks.  Please also call with any specific questions about appointments or follow up tests.YOU HAD AN ENDOSCOPIC PROCEDURE TODAY: Refer to the procedure report and other information in the discharge instructions given to you for any specific questions about what was found during the examination. If this information does not answer your questions, please call [place] office at [phone] to clarify.   YOU SHOULD EXPECT: Some feelings of bloating in the abdomen. Passage of more gas than usual. Walking can help get rid of the air that was put into your GI tract during the procedure and reduce the bloating. If you had a lower endoscopy (such as a colonoscopy or flexible sigmoidoscopy) you may notice spotting of blood in your stool or on the toilet paper. Some abdominal soreness may be present for a day or two, also.  DIET: Your first meal following the procedure should be a light meal and then it is ok to progress to your normal diet. A half-sandwich or bowl of soup is an example of a   good first meal. Heavy or fried foods are harder to digest and may make you feel nauseous or bloated. Drink plenty of fluids but you should avoid alcoholic beverages for 24 hours. If you had a esophageal dilation, please see attached instructions for diet.    ACTIVITY: Your care partner should take you home directly after the procedure. You should plan to take it easy, moving slowly for the rest of the day. You can resume  normal activity the day after the procedure however YOU SHOULD NOT DRIVE, use power tools, machinery or perform tasks that involve climbing or major physical exertion for 24 hours (because of the sedation medicines used during the test).   SYMPTOMS TO REPORT IMMEDIATELY: A gastroenterologist can be reached at any hour. Please call [phone]  for any of the following symptoms:  Following lower endoscopy (colonoscopy, flexible sigmoidoscopy) Excessive amounts of blood in the stool  Significant tenderness, worsening of abdominal pains  Swelling of the abdomen that is new, acute  Fever of 100 or higher  Following upper endoscopy (EGD, EUS, ERCP, esophageal dilation) Vomiting of blood or coffee ground material  New, significant abdominal pain  New, significant chest pain or pain under the shoulder blades  Painful or persistently difficult swallowing  New shortness of breath  Black, tarry-looking or red, bloody stools  FOLLOW UP:  If any biopsies were taken you will be contacted by phone or by letter within the next 1-3 weeks. Call [phone]  if you have not heard about the biopsies in 3 weeks.  Please also call with any specific questions about appointments or follow up tests. 

## 2023-03-19 NOTE — H&P (Signed)
History and Physical:  This patient presents for endoscopic testing for: History of colon polyps    57 year old woman with history of colon polyps here for surveillance colonoscopy. November 2019, 15 mm cecal SSP December 2020, 8 and 15 mm ascending colon SSP's May 2022, diminutive cecal SSP, 8 mm ICV SSP  Procedure done in hospital setting due to morbid obesity (BMI over 60)  Patient is otherwise without complaints or active issues today.   Past Medical History: Past Medical History:  Diagnosis Date   Allergic rhinitis    Allergy    seasonal allergies   B12 deficiency    GERD (gastroesophageal reflux disease)    on meds   Hemorrhoids    Hiatal hernia    as noted on barium swallow   History of gestational diabetes    Hyperplastic colon polyp    Hypothyroidism    on meds   Microcytic anemia    on iron medication    Obesity    Thyroid disease    hypothyroid     Past Surgical History: Past Surgical History:  Procedure Laterality Date   BIOPSY  09/27/2019   Procedure: BIOPSY;  Surgeon: [name], MD;  Location: [facility];  Service: Gastroenterology;;   CESAREAN SECTION  2001   COLONOSCOPY N/A 10/24/2013   Procedure: COLONOSCOPY;  Surgeon: Louis Meckel, MD;  Location: [facility];  Service: Endoscopy;  Laterality: N/A;   COLONOSCOPY  2020   at [facility])   COLONOSCOPY WITH PROPOFOL N/A 08/10/2018   Procedure: COLONOSCOPY WITH PROPOFOL;  Surgeon: [name], MD;  Location: [facility];  Service: Gastroenterology;  Laterality: N/A;   COLONOSCOPY WITH PROPOFOL N/A 09/27/2019   Procedure: COLONOSCOPY WITH PROPOFOL;  Surgeon: [name], MD;  Location: [facility];  Service: Gastroenterology;  Laterality: N/A;   COLONOSCOPY WITH PROPOFOL N/A 02/05/2021   Procedure: COLONOSCOPY WITH PROPOFOL;  Surgeon: [name], MD;  Location: [facility];  Service: Gastroenterology;  Laterality: N/A;   GASTRIC BYPASS  2007    HYSTEROSCOPY  08/2010   POLYPECTOMY  08/10/2018   Procedure: POLYPECTOMY;  Surgeon: [name], MD;  Location: [facility];  Service: Gastroenterology;;   POLYPECTOMY  09/27/2019   Procedure: POLYPECTOMY;  Surgeon: [name], MD;  Location: [facility];  Service: Gastroenterology;;   POLYPECTOMY  02/05/2021   Procedure: POLYPECTOMY;  Surgeon: [name], MD;  Location: [facility];  Service: Gastroenterology;;    Allergies: No Known Allergies  Outpatient Meds: Current Facility-Administered Medications  Medication Dose Route Frequency Provider Last Rate Last Admin   lactated ringers infusion   Intravenous Continuous Charlie Pitter III, MD 125 mL/hr at 03/19/23 1022 Continued from Pre-op at 03/19/23 1022      ___________________________________________________________________ Objective   Exam:  BP 129/69   Pulse 66   Temp 98.2 F (36.8 C) (Temporal)   Resp 20   Ht 5\' 2"  (1.575 m)   Wt (!) 156.5 kg   LMP 09/05/2014   SpO2 97%   BMI 63.10 kg/m   CV: regular , S1/S2 Resp: clear to auscultation bilaterally, normal RR and effort noted GI: soft, no tenderness, with active bowel sounds.   Assessment: History of colon polyps   Plan: Colonoscopy  The benefits and risks of the planned procedure were described in detail with the patient or (when appropriate) their health care proxy.  Risks were outlined as including, but not limited to, bleeding, infection, perforation, adverse medication  reaction leading to cardiac or pulmonary decompensation, pancreatitis (if ERCP).  The limitation of incomplete mucosal visualization was also discussed.  No guarantees or warranties were given.  The patient is appropriate for an endoscopic procedure in the ambulatory setting.   - Crystal Jupiter, MD

## 2023-03-19 NOTE — Transfer of Care (Signed)
Immediate Anesthesia Transfer of Care Note  Patient: Crystal Wall  Procedure(s) Performed: COLONOSCOPY WITH PROPOFOL POLYPECTOMY  Patient Location: Endoscopy Unit  Anesthesia Type:MAC  Level of Consciousness: awake, alert , and oriented  Airway & Oxygen Therapy: Patient Spontanous Breathing  Post-op Assessment: Report given to RN, Post -op Vital signs reviewed and stable, and Patient moving all extremities X 4  Post vital signs: Reviewed and stable  Last Vitals:  Vitals Value Taken Time  BP 93/53 03/19/23 1204  Temp 36.3 C 03/19/23 1203  Pulse 56 03/19/23 1210  Resp 17 03/19/23 1210  SpO2 97 % 03/19/23 1210  Vitals shown include unvalidated device data.  Last Pain:  Vitals:   03/19/23 1203  TempSrc: Tympanic  PainSc: 0-No pain         Complications: No notable events documented.

## 2023-03-20 LAB — SURGICAL PATHOLOGY

## 2023-03-20 NOTE — Anesthesia Postprocedure Evaluation (Signed)
Anesthesia Post Note  Patient: Crystal Wall  Procedure(s) Performed: COLONOSCOPY WITH PROPOFOL POLYPECTOMY     Patient location during evaluation: PACU Anesthesia Type: MAC Level of consciousness: awake and alert Pain management: pain level controlled Vital Signs Assessment: post-procedure vital signs reviewed and stable Respiratory status: spontaneous breathing, nonlabored ventilation, respiratory function stable and patient connected to nasal cannula oxygen Cardiovascular status: stable and blood pressure returned to baseline Postop Assessment: no apparent nausea or vomiting Anesthetic complications: no   No notable events documented.  Last Vitals:  Vitals:   03/19/23 1222 03/19/23 1229  BP: 100/66 114/61  Pulse: (!) 47 (!) 43  Resp: 18 16  Temp:    SpO2: 98% 100%    Last Pain:  Vitals:   03/19/23 1229  TempSrc:   PainSc: 0-No pain                 Earl Lites P [name]

## 2023-03-23 ENCOUNTER — Encounter (HOSPITAL_COMMUNITY): Payer: Self-pay | Admitting: Gastroenterology

## 2023-03-24 ENCOUNTER — Encounter: Payer: Self-pay | Admitting: Gastroenterology

## 2023-06-15 ENCOUNTER — Other Ambulatory Visit: Payer: Self-pay | Admitting: *Deleted

## 2023-06-15 DIAGNOSIS — M79606 Pain in leg, unspecified: Secondary | ICD-10-CM

## 2023-06-16 ENCOUNTER — Encounter: Payer: Self-pay | Admitting: Nurse Practitioner

## 2023-06-18 ENCOUNTER — Encounter: Payer: Self-pay | Admitting: Nurse Practitioner

## 2023-06-22 NOTE — Progress Notes (Unsigned)
[facility] [place]  ASSESSMENT / PLAN: Crystal Wall is a 57 y.o. female with chronic venous insufficiency of *** lower extremity causing *** (C*** disease).  Venous duplex is significant for *** Recommend compression and elevation for symptomatic relief. ***Follow up with me in three months to discuss saphenous vein ablation. ***Follow up with me as needed.   CHIEF COMPLAINT: ***  HISTORY OF PRESENT ILLNESS: Crystal Wall is a 57 y.o. female ***  VASCULAR SURGICAL HISTORY: ***  VASCULAR RISK FACTORS: {FINDINGS; POSITIVE NEGATIVE:(304) 023-7524} history of stroke / transient ischemic attack. {FINDINGS; POSITIVE NEGATIVE:(304) 023-7524} history of coronary artery disease. *** history of PCI. *** history of CABG.  {FINDINGS; POSITIVE NEGATIVE:(304) 023-7524} history of diabetes mellitus. Last A1c ***. {FINDINGS; POSITIVE NEGATIVE:(304) 023-7524} history of smoking. *** actively smoking. {FINDINGS; POSITIVE NEGATIVE:(304) 023-7524} history of hypertension. *** drug regimen with *** control. {FINDINGS; POSITIVE NEGATIVE:(304) 023-7524} history of chronic kidney disease.  Last GFR ***. CKD {stage:30421363}. {FINDINGS; POSITIVE NEGATIVE:(304) 023-7524} history of chronic obstructive pulmonary disease, treated with ***.  FUNCTIONAL STATUS: ECOG performance status: {findings; ecog performance status:31780} Ambulatory status: {TNHAmbulation:25868}  CAREY 1 AND 3 YEAR INDEX Female (2pts) 75-79 or 80-84 (2pts) >84 (3pts) Dependence in toileting (1pt) Partial or full dependence in dressing (1pt) History of malignant neoplasm (2pts) CHF (3pts) COPD (1pts) CKD (3pts)  0-3 pts 6% 1 year mortality ; 21% 3 year mortality 4-5 pts 12% 1 year mortality ; 36% 3 year mortality >5 pts 21% 1 year mortality; 54% 3 year mortality   Past Medical History:  Diagnosis Date   Allergic rhinitis    Allergy    seasonal allergies   B12 deficiency    GERD (gastroesophageal reflux disease)    on  meds   Hemorrhoids    Hiatal hernia    as noted on barium swallow   History of gestational diabetes    Hyperplastic colon polyp    Hypothyroidism    on meds   Microcytic anemia    on iron medication    Obesity    Thyroid disease    hypothyroid    Past Surgical History:  Procedure Laterality Date   BIOPSY  09/27/2019   Procedure: BIOPSY;  Surgeon: [name], MD;  Location: [facility];  Service: Gastroenterology;;   CESAREAN SECTION  2001   COLONOSCOPY N/A 10/24/2013   Procedure: COLONOSCOPY;  Surgeon: Louis Meckel, MD;  Location: [facility];  Service: Endoscopy;  Laterality: N/A;   COLONOSCOPY  2020   at [facility])   COLONOSCOPY WITH PROPOFOL N/A 08/10/2018   Procedure: COLONOSCOPY WITH PROPOFOL;  Surgeon: [name], MD;  Location: [facility];  Service: Gastroenterology;  Laterality: N/A;   COLONOSCOPY WITH PROPOFOL N/A 09/27/2019   Procedure: COLONOSCOPY WITH PROPOFOL;  Surgeon: [name], MD;  Location: [facility];  Service: Gastroenterology;  Laterality: N/A;   COLONOSCOPY WITH PROPOFOL N/A 02/05/2021   Procedure: COLONOSCOPY WITH PROPOFOL;  Surgeon: [name], MD;  Location: [facility];  Service: Gastroenterology;  Laterality: N/A;   COLONOSCOPY WITH PROPOFOL N/A 03/19/2023   Procedure: COLONOSCOPY WITH PROPOFOL;  Surgeon: [name], MD;  Location: [facility] ENDOSCOPY;  Service: Gastroenterology;  Laterality: N/A;   GASTRIC BYPASS  2007   HYSTEROSCOPY  08/2010   POLYPECTOMY  08/10/2018   Procedure: POLYPECTOMY;  Surgeon: [name], MD;  Location: [facility];  Service: Gastroenterology;;   POLYPECTOMY  09/27/2019   Procedure: POLYPECTOMY;  Surgeon: [name], MD;  Location:  [facility];  Service: Gastroenterology;;   POLYPECTOMY  02/05/2021   Procedure: POLYPECTOMY;  Surgeon: [name], MD;  Location: [facility];  Service: Gastroenterology;;   POLYPECTOMY  03/19/2023   Procedure:  POLYPECTOMY;  Surgeon: [name], MD;  Location: [facility] ENDOSCOPY;  Service: Gastroenterology;;    Family History  Problem Relation Age of Onset   Hypertension Mother    Colon polyps Mother    Heart disease Father        CHF   Hypertension Father    Prostate cancer Father    Congestive Heart Failure Father    Colon polyps Father    Diabetes Sister    Hypertension Sister    Colon cancer Sister 60   Uterine cancer Sister    Colon polyps Sister 43   Diabetes Maternal Grandmother    Esophageal cancer Neg Hx    Rectal cancer Neg Hx    Stomach cancer Neg Hx    Crohn's disease Neg Hx    Ulcerative colitis Neg Hx     Social History   Socioeconomic History   Marital status: Married    Spouse name: Not on file   Number of children: 1   Years of education: Not on file   Highest education level: Not on file  Occupational History   Not on file  Tobacco Use   Smoking status: Never   Smokeless tobacco: Never  Vaping Use   Vaping status: Never Used  Substance and Sexual Activity   Alcohol use: Yes    Comment: occassionally   Drug use: No   Sexual activity: Yes    Birth control/protection: Other-see comments    Comment: vasectomy-1st intercourse 57 yo-More than 5 partners  Other Topics Concern   Not on file  Social History Narrative   Not on file   Social Determinants of Health   Financial Resource Strain: Not on file  Food Insecurity: Not on file  Transportation Needs: Not on file  Physical Activity: Not on file  Stress: Not on file  Social Connections: Not on file  Intimate Partner Violence: Not on file    No Known Allergies  Current Outpatient Medications  Medication Sig Dispense Refill   CALCIUM PO Take 1 tablet by mouth daily.     cyanocobalamin (,VITAMIN B-12,) 1000 MCG/ML injection Inject 1,000 mcg into the muscle every 30 (thirty) days.      levothyroxine (SYNTHROID) 112 MCG tablet Take 112 mcg by mouth daily before breakfast.  6   Multiple Vitamin  (MULTIVITAMIN) tablet Take 1 tablet by mouth daily.     omeprazole (PRILOSEC) 20 MG capsule Take 20 mg by mouth daily.     No current facility-administered medications for this visit.    PHYSICAL EXAM There were no vitals filed for this visit.  Constitutional: *** appearing. *** distress. Appears *** nourished.  Neurologic: CN ***. *** focal findings. *** sensory loss. Psychiatric: *** Mood and affect symmetric and appropriate. Eyes: *** No icterus. No conjunctival pallor. Ears, nose, throat: *** mucous membranes moist. Midline trachea.  Cardiac: *** rate and rhythm.  Respiratory: *** unlabored. Abdominal: *** soft, non-tender, non-distended.  Peripheral vascular: *** Extremity: *** edema. *** cyanosis. *** pallor.  Skin: *** gangrene. *** ulceration.  Lymphatic: *** Stemmer's sign. *** palpable lymphadenopathy.    PERTINENT LABORATORY AND RADIOLOGIC DATA  Most recent CBC    Latest Ref Rng & Units 10/21/2019    9:35 AM 04/26/2019    8:42 AM 11/26/2018  2:14 PM  CBC  WBC 4.0 - 10.5 K/uL 5.0  4.8  6.0   Hemoglobin 12.0 - 15.0 g/dL 65.7  84.6  96.2   Hematocrit 36.0 - 46.0 % 40.2  40.7  41.0   Platelets 150 - 400 K/uL 219  194  207      Most recent CMP     No data to display          Renal function CrCl cannot be calculated (No successful lab value found.).  No results found for: "HGBA1C"  No results found for: "LDLCALC", "LDLC", "HIRISKLDL", "POCLDL", "LDLDIRECT", "REALLDLC", "TOTLDLC"   Vascular Imaging: ***  Kiarah Eckstein N. Lenell Antu, MD FACS [facility] Phone Number: [phone] 06/22/2023 10:43 AM   Total time spent on preparing this encounter including chart review, data review, collecting history, examining the patient, coordinating care for this {tnhtimebilling:26202}  Portions of this report may have been transcribed using voice recognition software.  Every effort has been made to ensure accuracy; however,  inadvertent computerized transcription errors may still be present.

## 2023-06-23 ENCOUNTER — Ambulatory Visit: Payer: 59 | Admitting: Vascular Surgery

## 2023-06-23 ENCOUNTER — Encounter: Payer: Self-pay | Admitting: Nurse Practitioner

## 2023-06-23 ENCOUNTER — Encounter: Payer: Self-pay | Admitting: Vascular Surgery

## 2023-06-23 ENCOUNTER — Ambulatory Visit (HOSPITAL_COMMUNITY)
Admission: RE | Admit: 2023-06-23 | Discharge: 2023-06-23 | Disposition: A | Discharge status: Home / Self Care | Payer: 59 | Source: Ambulatory Visit | Attending: Vascular Surgery | Admitting: Vascular Surgery

## 2023-06-23 VITALS — BP 111/68 | HR 63 | Temp 97.9°F | Resp 20 | Ht 62.0 in | Wt 344.0 lb

## 2023-06-23 DIAGNOSIS — R609 Edema, unspecified: Secondary | ICD-10-CM

## 2023-06-23 DIAGNOSIS — M79606 Pain in leg, unspecified: Secondary | ICD-10-CM | POA: Diagnosis present

## 2023-06-23 DIAGNOSIS — M79651 Pain in right thigh: Secondary | ICD-10-CM | POA: Diagnosis not present

## 2024-04-19 ENCOUNTER — Inpatient Hospital Stay: Attending: Hematology | Admitting: Hematology

## 2024-04-19 ENCOUNTER — Inpatient Hospital Stay

## 2024-04-19 ENCOUNTER — Encounter: Payer: Self-pay | Admitting: Hematology

## 2024-04-19 ENCOUNTER — Other Ambulatory Visit: Payer: Self-pay

## 2024-04-19 VITALS — BP 138/72 | HR 78 | Temp 97.5°F | Resp 18 | Wt 344.8 lb

## 2024-04-19 DIAGNOSIS — D649 Anemia, unspecified: Secondary | ICD-10-CM

## 2024-04-19 DIAGNOSIS — Z9884 Bariatric surgery status: Secondary | ICD-10-CM | POA: Insufficient documentation

## 2024-04-19 DIAGNOSIS — D508 Other iron deficiency anemias: Secondary | ICD-10-CM

## 2024-04-19 DIAGNOSIS — D509 Iron deficiency anemia, unspecified: Secondary | ICD-10-CM | POA: Insufficient documentation

## 2024-04-19 DIAGNOSIS — E039 Hypothyroidism, unspecified: Secondary | ICD-10-CM | POA: Diagnosis not present

## 2024-04-19 DIAGNOSIS — Z808 Family history of malignant neoplasm of other organs or systems: Secondary | ICD-10-CM | POA: Diagnosis not present

## 2024-04-19 DIAGNOSIS — Z79899 Other long term (current) drug therapy: Secondary | ICD-10-CM | POA: Diagnosis not present

## 2024-04-19 DIAGNOSIS — Z800 Family history of malignant neoplasm of digestive organs: Secondary | ICD-10-CM | POA: Insufficient documentation

## 2024-04-19 DIAGNOSIS — Z8042 Family history of malignant neoplasm of prostate: Secondary | ICD-10-CM | POA: Insufficient documentation

## 2024-04-19 DIAGNOSIS — Z83719 Family history of colon polyps, unspecified: Secondary | ICD-10-CM | POA: Insufficient documentation

## 2024-04-19 DIAGNOSIS — E538 Deficiency of other specified B group vitamins: Secondary | ICD-10-CM | POA: Insufficient documentation

## 2024-04-19 LAB — RETIC PANEL
Immature Retic Fract: 12.5 % (ref 2.3–15.9)
RBC.: 4.5 MIL/uL (ref 3.87–5.11)
Retic Count, Absolute: 59.4 K/uL (ref 19.0–186.0)
Retic Ct Pct: 1.3 % (ref 0.4–3.1)
Reticulocyte Hemoglobin: 32.1 pg (ref >27.9)

## 2024-04-19 LAB — CBC WITH DIFFERENTIAL (CANCER CENTER ONLY)
Abs Immature Granulocytes: 0.01 K/uL (ref 0.00–0.07)
Basophils Absolute: 0 K/uL (ref 0.0–0.1)
Basophils Relative: 1 %
Eosinophils Absolute: 0.2 K/uL (ref 0.0–0.5)
Eosinophils Relative: 2 %
HCT: 37 % (ref 36.0–46.0)
Hemoglobin: 12.4 g/dL (ref 12.0–15.0)
Immature Granulocytes: 0 %
Lymphocytes Relative: 20 %
Lymphs Abs: 1.4 K/uL (ref 0.7–4.0)
MCH: 28.1 pg (ref 26.0–34.0)
MCHC: 33.5 g/dL (ref 30.0–36.0)
MCV: 83.9 fL (ref 80.0–100.0)
Monocytes Absolute: 0.4 K/uL (ref 0.1–1.0)
Monocytes Relative: 5 %
Neutro Abs: 5.1 K/uL (ref 1.7–7.7)
Neutrophils Relative %: 72 %
Platelet Count: 226 K/uL (ref 150–400)
RBC: 4.41 MIL/uL (ref 3.87–5.11)
RDW: 14.5 % (ref 11.5–15.5)
WBC Count: 7.1 K/uL (ref 4.0–10.5)
nRBC: 0 % (ref 0.0–0.2)

## 2024-04-19 LAB — VITAMIN B12: Vitamin B-12: 424 pg/mL (ref 180–914)

## 2024-04-19 LAB — FERRITIN: Ferritin: 14 ng/mL (ref 11–307)

## 2024-04-19 NOTE — Progress Notes (Signed)
 [facility]   [phone] [phone]   Clinic New Consult Note   Patient Care Team: Valma Carwin, MD as PCP - General (Internal Medicine) 04/19/2024  CHIEF COMPLAINTS/PURPOSE OF CONSULTATION:  Iron deficient anemia  REFERRING PHYSICIAN: Valma Carwin, MD   Discussed the use of AI scribe software for clinical note transcription with the patient, who gave verbal consent to proceed.  History of Present Illness Crystal Wall is a 58 year old female with a history of gastric bypass surgery who presents with iron deficiency anemia. She was referred by her primary care physician for evaluation of iron deficiency.  She was previously seen by me for iron deficient anemia, last visit in 2021.  She has a history of iron deficiency anemia managed with IV iron since September 2019, following gastric bypass surgery in 2007. Her anemia resolved after the last IV iron treatment in 2019, but her iron levels have recently decreased again. Hemoglobin levels are normal, but ferritin and iron levels are low. Oral iron supplements cause gastrointestinal side effects, leading to inconsistent use.  Her past medical history includes B12 deficiency managed with monthly injections and thyroid disease treated with Synthroid. She has undergone several surgeries, including a C-section, colonoscopy, and gastric bypass surgery. Current medications include calcium, B12 injections, a multivitamin, omeprazole, and Zepbound for weight management.     MEDICAL HISTORY:  Past Medical History:  Diagnosis Date   Allergic rhinitis    Allergy    seasonal allergies   B12 deficiency    GERD (gastroesophageal reflux disease)    on meds   Hemorrhoids    Hiatal hernia    as noted on barium swallow   History of gestational diabetes    Hyperplastic colon polyp    Hypothyroidism    on meds   Microcytic anemia    on iron medication    Obesity    Thyroid disease    hypothyroid     SURGICAL HISTORY: Past Surgical History:  Procedure Laterality Date   BIOPSY  09/27/2019   Procedure: BIOPSY;  Surgeon: Legrand Victory LITTIE DOUGLAS, MD;  Location: [facility];  Service: Gastroenterology;;   CESAREAN SECTION  2001   COLONOSCOPY N/A 10/24/2013   Procedure: COLONOSCOPY;  Surgeon: Lamar JONETTA Aho, MD;  Location: [facility];  Service: Endoscopy;  Laterality: N/A;   COLONOSCOPY  2020   at [facility] ([facility])   COLONOSCOPY WITH PROPOFOL  N/A 08/10/2018   Procedure: COLONOSCOPY WITH PROPOFOL ;  Surgeon: Legrand Victory LITTIE DOUGLAS, MD;  Location: [facility];  Service: Gastroenterology;  Laterality: N/A;   COLONOSCOPY WITH PROPOFOL  N/A 09/27/2019   Procedure: COLONOSCOPY WITH PROPOFOL ;  Surgeon: Legrand Victory LITTIE DOUGLAS, MD;  Location: [facility];  Service: Gastroenterology;  Laterality: N/A;   COLONOSCOPY WITH PROPOFOL  N/A 02/05/2021   Procedure: COLONOSCOPY WITH PROPOFOL ;  Surgeon: Legrand Victory LITTIE DOUGLAS, MD;  Location: [facility];  Service: Gastroenterology;  Laterality: N/A;   COLONOSCOPY WITH PROPOFOL  N/A 03/19/2023   Procedure: COLONOSCOPY WITH PROPOFOL ;  Surgeon: Legrand Victory LITTIE DOUGLAS, MD;  Location: [facility] ENDOSCOPY;  Service: Gastroenterology;  Laterality: N/A;   GASTRIC BYPASS  2007   HYSTEROSCOPY  08/2010   POLYPECTOMY  08/10/2018   Procedure: POLYPECTOMY;  Surgeon: Legrand Victory LITTIE DOUGLAS, MD;  Location: [facility];  Service: Gastroenterology;;   POLYPECTOMY  09/27/2019   Procedure: POLYPECTOMY;  Surgeon: Legrand Victory LITTIE DOUGLAS, MD;  Location: [facility];  Service: Gastroenterology;;   POLYPECTOMY  02/05/2021   Procedure: POLYPECTOMY;  Surgeon: Legrand Victory LITTIE  III, MD;  Location: [facility];  Service: Gastroenterology;;   POLYPECTOMY  03/19/2023   Procedure: POLYPECTOMY;  Surgeon: Legrand Victory LITTIE DOUGLAS, MD;  Location: [facility] ENDOSCOPY;  Service: Gastroenterology;;    SOCIAL HISTORY: Social History   Socioeconomic History   Marital status: Married    Spouse name: Not on file   Number of children: 1    Years of education: Not on file   Highest education level: Not on file  Occupational History   Not on file  Tobacco Use   Smoking status: Never   Smokeless tobacco: Never  Vaping Use   Vaping status: Never Used  Substance and Sexual Activity   Alcohol use: Yes    Comment: occassionally   Drug use: No   Sexual activity: Yes    Birth control/protection: Other-see comments    Comment: vasectomy-1st intercourse 58 yo-More than 5 partners  Other Topics Concern   Not on file  Social History Narrative   Not on file   Social Drivers of Health   Financial Resource Strain: Not on file  Food Insecurity: No Food Insecurity (04/19/2024)   Hunger Vital Sign    Worried About Running Out of Food in the Last Year: Never true    Ran Out of Food in the Last Year: Never true  Transportation Needs: No Transportation Needs (04/19/2024)   PRAPARE - Administrator, Civil Service (Medical): No    Lack of Transportation (Non-Medical): No  Physical Activity: Not on file  Stress: Not on file  Social Connections: Not on file  Intimate Partner Violence: Not At Risk (04/19/2024)   Humiliation, Afraid, Rape, and Kick questionnaire    Fear of Current or Ex-Partner: No    Emotionally Abused: No    Physically Abused: No    Sexually Abused: No    FAMILY HISTORY: Family History  Problem Relation Age of Onset   Hypertension Mother    Colon polyps Mother    Heart disease Father        CHF   Hypertension Father    Prostate cancer Father    Congestive Heart Failure Father    Colon polyps Father    Diabetes Sister    Hypertension Sister    Colon cancer Sister 40   Uterine cancer Sister    Colon polyps Sister 59   Diabetes Maternal Grandmother    Esophageal cancer Neg Hx    Rectal cancer Neg Hx    Stomach cancer Neg Hx    Crohn's disease Neg Hx    Ulcerative colitis Neg Hx     ALLERGIES:  has no known allergies.  MEDICATIONS:  Current Outpatient Medications  Medication Sig  Dispense Refill   CALCIUM PO Take 1 tablet by mouth daily.     cyanocobalamin (,VITAMIN B-12,) 1000 MCG/ML injection Inject 1,000 mcg into the muscle every 30 (thirty) days.      levothyroxine (SYNTHROID) 112 MCG tablet Take 112 mcg by mouth daily before breakfast.  6   Multiple Vitamin (MULTIVITAMIN) tablet Take 1 tablet by mouth daily.     omeprazole (PRILOSEC) 20 MG capsule Take 20 mg by mouth daily.     No current facility-administered medications for this visit.    REVIEW OF SYSTEMS:   Constitutional: Denies fevers, chills or abnormal night sweats Eyes: Denies blurriness of vision, double vision or watery eyes Ears, nose, mouth, throat, and face: Denies mucositis or sore throat Respiratory: Denies cough, dyspnea or wheezes Cardiovascular: Denies palpitation, chest discomfort or  lower extremity swelling Gastrointestinal:  Denies nausea, heartburn or change in bowel habits Skin: Denies abnormal skin rashes Lymphatics: Denies new lymphadenopathy or easy bruising Neurological:Denies numbness, tingling or new weaknesses Behavioral/Psych: Mood is stable, no new changes  All other systems were reviewed with the patient and are negative.  PHYSICAL EXAMINATION: ECOG PERFORMANCE STATUS: 1 - Symptomatic but completely ambulatory  Vitals:   04/19/24 1415  BP: 138/72  Pulse: 78  Resp: 18  Temp: (!) 97.5 F (36.4 C)  SpO2: 96%   Filed Weights   04/19/24 1415  Weight: (!) 344 lb 12.8 oz (156.4 kg)    GENERAL:alert, no distress and comfortable SKIN: skin color, texture, turgor are normal, no rashes or significant lesions EYES: normal, conjunctiva are pink and non-injected, sclera clear OROPHARYNX:no exudate, no erythema and lips, buccal mucosa, and tongue normal  NECK: supple, thyroid normal size, non-tender, without nodularity LYMPH:  no palpable lymphadenopathy in the cervical, axillary or inguinal LUNGS: clear to auscultation and percussion with normal breathing effort HEART:  regular rate & rhythm and no murmurs and no lower extremity edema ABDOMEN:abdomen soft, non-tender and normal bowel sounds Musculoskeletal:no cyanosis of digits and no clubbing  PSYCH: alert & oriented x 3 with fluent speech NEURO: no focal motor/sensory deficits  Physical Exam ABDOMEN: Abdomen non-tender.  LABORATORY DATA:  I have reviewed the data as listed    Latest Ref Rng & Units 04/19/2024    2:06 PM 10/21/2019    9:35 AM 04/26/2019    8:42 AM  CBC  WBC 4.0 - 10.5 K/uL 7.1  5.0  4.8   Hemoglobin 12.0 - 15.0 g/dL 87.5  86.4  86.1   Hematocrit 36.0 - 46.0 % 37.0  40.2  40.7   Platelets 150 - 400 K/uL 226  219  194        No data to display           RADIOGRAPHIC STUDIES: I have personally reviewed the radiological images as listed and agreed with the findings in the report. No results found.   Assessment & Plan Iron deficiency anemia secondary to gastric bypass surgery Iron deficiency anemia likely secondary to gastric bypass surgery. Current hemoglobin is normal at 12.4, but ferritin and iron levels are low, consistent with iron deficiency. No symptoms of fatigue or dyspnea reported, possibly due to attributing symptoms to weight. Oral iron causes gastrointestinal upset and constipation, leading to poor adherence. - Refer to [facility] for IV iron venofer therapy with 200 mg weeklyX5, administered as a quick push, likely for five sessions. - Instruct to follow up with primary care physician every six months for lab work to monitor iron levels. - Advise to contact if ferritin level drops below 20 for further management.  Gastric bypass surgery Gastric bypass surgery in 2007, contributing to current iron deficiency anemia.  Vitamin B12 deficiency Vitamin B12 deficiency managed with monthly B12 injections at home.  Hypothyroidism, unspecified Hypothyroidism managed with Synthroid.  Plan - I reviewed her outside lab and the lab today - Will refer her to [facility] for IV Venofer 200 mg weekly x 5 - She will have lab, including ferritin and CBC every 6 months with her PCP, and call me if her ferritin is below 20.  I will see her as needed   Orders Placed This Encounter  Procedures   Retic Panel    Standing Status:   Future    Number of Occurrences:   1  Expiration Date:   04/19/2025    All questions were answered. The patient knows to call the clinic with any problems, questions or concerns. I spent 25 minutes counseling the patient face to face. The total time spent in the appointment was 30 minutes including review of chart and various tests results, discussions about plan of care and coordination of care plan.     Onita Mattock, MD 04/19/2024 4:30 PM

## 2024-04-20 ENCOUNTER — Telehealth: Payer: Self-pay

## 2024-04-20 LAB — IRON AND IRON BINDING CAPACITY (CC-WL,HP ONLY)
Iron: 61 ug/dL (ref 28–170)
Saturation Ratios: 13 % (ref 10.4–31.8)
TIBC: 468 ug/dL — ABNORMAL HIGH (ref 250–450)
UIBC: 407 ug/dL (ref 148–442)

## 2024-04-20 NOTE — Telephone Encounter (Signed)
 Dr. Lanny, patient will be scheduled as soon as possible.  Auth Submission: NO AUTH NEEDED Site of care: Site of care: [facility] Payer: [employer] commercial Medication & CPT/J Code(s) submitted: Venofer (Iron Sucrose) J1756 Diagnosis Code:  Route of submission (phone, fax, portal):  Phone # Fax # Auth type: Buy/Bill PB Units/visits requested: 200mg  x 5 doses Reference number:  Approval from: 04/20/24 to 08/21/24

## 2024-04-22 ENCOUNTER — Ambulatory Visit

## 2024-04-22 VITALS — BP 128/74 | HR 54 | Temp 97.5°F | Resp 18 | Ht 62.0 in | Wt 341.8 lb

## 2024-04-22 DIAGNOSIS — D508 Other iron deficiency anemias: Secondary | ICD-10-CM

## 2024-04-22 DIAGNOSIS — D509 Iron deficiency anemia, unspecified: Secondary | ICD-10-CM | POA: Diagnosis not present

## 2024-04-22 MED ORDER — DIPHENHYDRAMINE HCL 25 MG PO CAPS: 25.0000 mg | ORAL_CAPSULE | Freq: Once | ORAL | Status: DC

## 2024-04-22 MED ORDER — ACETAMINOPHEN 325 MG PO TABS: 650.0000 mg | ORAL_TABLET | Freq: Once | ORAL | Status: DC

## 2024-04-22 MED ORDER — IRON SUCROSE 20 MG/ML IV SOLN
200.0000 mg | Freq: Once | INTRAVENOUS | Status: AC
  Administered 2024-04-22: 200 mg via INTRAVENOUS
  Filled 2024-04-22: qty 10

## 2024-04-22 NOTE — Progress Notes (Signed)
 Diagnosis: Iron  Deficiency Anemia  Provider:  Praveen Mannam MD  Procedure: IV Push  IV Type: Peripheral, IV Location: L Antecubital  Venofer  (Iron  Sucrose), Dose: 200 mg  Post Infusion IV Care: Observation period completed  Discharge: Condition: Good, Destination: Home . AVS Declined  Performed by:  Rachelle Bue, RN

## 2024-04-25 ENCOUNTER — Ambulatory Visit

## 2024-04-25 VITALS — BP 127/67 | HR 65 | Temp 98.1°F | Resp 24 | Ht 62.0 in | Wt 342.6 lb

## 2024-04-25 DIAGNOSIS — D508 Other iron deficiency anemias: Secondary | ICD-10-CM

## 2024-04-25 DIAGNOSIS — D509 Iron deficiency anemia, unspecified: Secondary | ICD-10-CM

## 2024-04-25 MED ORDER — IRON SUCROSE 20 MG/ML IV SOLN
200.0000 mg | Freq: Once | INTRAVENOUS | Status: AC
  Administered 2024-04-25: 200 mg via INTRAVENOUS
  Filled 2024-04-25: qty 10

## 2024-04-25 MED ORDER — ACETAMINOPHEN 325 MG PO TABS
650.0000 mg | ORAL_TABLET | Freq: Once | ORAL | Status: DC
Start: 2024-04-25 — End: 2024-04-25

## 2024-04-25 MED ORDER — DIPHENHYDRAMINE HCL 25 MG PO CAPS
25.0000 mg | ORAL_CAPSULE | Freq: Once | ORAL | Status: DC
Start: 2024-04-25 — End: 2024-04-25

## 2024-04-25 NOTE — Progress Notes (Signed)
 Diagnosis: Acute Anemia  Provider:  Chilton Greathouse MD  Procedure: IV Push  IV Type: Peripheral, IV Location: R Forearm  Venofer (Iron Sucrose), Dose: 200 mg  Post Infusion IV Care: Patient declined observation and Peripheral IV Discontinued  Discharge: Condition: Good, Destination: Home . AVS Declined  Performed by:  Nat Math, RN

## 2024-04-27 ENCOUNTER — Ambulatory Visit

## 2024-04-27 VITALS — BP 134/48 | HR 57 | Temp 97.4°F | Resp 18 | Ht 62.0 in | Wt 342.8 lb

## 2024-04-27 DIAGNOSIS — D509 Iron deficiency anemia, unspecified: Secondary | ICD-10-CM | POA: Diagnosis not present

## 2024-04-27 DIAGNOSIS — D508 Other iron deficiency anemias: Secondary | ICD-10-CM

## 2024-04-27 MED ORDER — DIPHENHYDRAMINE HCL 25 MG PO CAPS: 25.0000 mg | ORAL_CAPSULE | Freq: Once | ORAL | Status: DC

## 2024-04-27 MED ORDER — ACETAMINOPHEN 325 MG PO TABS
650.0000 mg | ORAL_TABLET | Freq: Once | ORAL | Status: DC
Start: 2024-04-27 — End: 2024-04-27

## 2024-04-27 MED ORDER — IRON SUCROSE 20 MG/ML IV SOLN
200.0000 mg | Freq: Once | INTRAVENOUS | Status: AC
  Administered 2024-04-27: 200 mg via INTRAVENOUS
  Filled 2024-04-27: qty 10

## 2024-04-27 NOTE — Progress Notes (Signed)
 Diagnosis: Iron Deficiency Anemia  Provider:  Chilton Greathouse MD  Procedure: IV Push  IV Type: Peripheral, IV Location: L Antecubital  Venofer (Iron Sucrose), Dose: 200 mg  Post Infusion IV Care: Observation period completed and Peripheral IV Discontinued  Discharge: Condition: Good, Destination: Home . AVS Declined  Performed by:  Loney Hering, LPN

## 2024-04-29 ENCOUNTER — Ambulatory Visit

## 2024-04-29 VITALS — BP 149/73 | HR 61 | Temp 98.4°F | Resp 18 | Ht 62.0 in | Wt 342.6 lb

## 2024-04-29 DIAGNOSIS — D509 Iron deficiency anemia, unspecified: Secondary | ICD-10-CM

## 2024-04-29 DIAGNOSIS — D508 Other iron deficiency anemias: Secondary | ICD-10-CM

## 2024-04-29 MED ORDER — ACETAMINOPHEN 325 MG PO TABS: 650.0000 mg | ORAL_TABLET | Freq: Once | ORAL | Status: DC

## 2024-04-29 MED ORDER — DIPHENHYDRAMINE HCL 25 MG PO CAPS: 25.0000 mg | ORAL_CAPSULE | Freq: Once | ORAL | Status: DC

## 2024-04-29 MED ORDER — IRON SUCROSE 20 MG/ML IV SOLN
200.0000 mg | Freq: Once | INTRAVENOUS | Status: AC
  Administered 2024-04-29: 200 mg via INTRAVENOUS
  Filled 2024-04-29: qty 10

## 2024-04-29 NOTE — Progress Notes (Signed)
 Diagnosis: Iron Deficiency Anemia  Provider:  Chilton Greathouse MD  Procedure: IV Push  IV Type: Peripheral, IV Location: L Antecubital  Venofer (Iron Sucrose), Dose: 200 mg  Post Infusion IV Care: Patient declined observation and Peripheral IV Discontinued  Discharge: Condition: Good, Destination: Home . AVS Declined  Performed by:  Loney Hering, LPN

## 2024-05-02 ENCOUNTER — Ambulatory Visit (INDEPENDENT_AMBULATORY_CARE_PROVIDER_SITE_OTHER)

## 2024-05-02 VITALS — BP 156/73 | HR 67 | Temp 98.0°F | Resp 18 | Ht 62.0 in | Wt 341.2 lb

## 2024-05-02 DIAGNOSIS — D509 Iron deficiency anemia, unspecified: Secondary | ICD-10-CM

## 2024-05-02 DIAGNOSIS — D508 Other iron deficiency anemias: Secondary | ICD-10-CM

## 2024-05-02 MED ORDER — IRON SUCROSE 20 MG/ML IV SOLN
200.0000 mg | Freq: Once | INTRAVENOUS | Status: AC
  Administered 2024-05-02: 200 mg via INTRAVENOUS
  Filled 2024-05-02: qty 10

## 2024-05-02 MED ORDER — ACETAMINOPHEN 325 MG PO TABS: 650.0000 mg | ORAL_TABLET | Freq: Once | ORAL | Status: DC

## 2024-05-02 MED ORDER — DIPHENHYDRAMINE HCL 25 MG PO CAPS: 25.0000 mg | ORAL_CAPSULE | Freq: Once | ORAL | Status: DC

## 2024-05-02 NOTE — Progress Notes (Signed)
 Diagnosis: Iron  Deficiency Anemia  Provider:  Praveen Mannam MD  Procedure: IV Push  IV Type: Peripheral, IV Location: L Antecubital  Venofer  (Iron  Sucrose), Dose: 200 mg  Post Infusion IV Care: Patient declined observation  Discharge: Condition: Good, Destination: Home . AVS Declined  Performed by:  Eleanor DELENA Bloch, RN
